# Patient Record
Sex: Female | Born: 1975 | Race: White | Hispanic: No | Marital: Single | Smoking: Former smoker
Health system: Southern US, Community
[De-identification: ages and names within clinical notes are randomized; demographics above are authoritative.]

## PROBLEM LIST (undated history)

## (undated) DIAGNOSIS — F329 Major depressive disorder, single episode, unspecified: Secondary | ICD-10-CM

## (undated) DIAGNOSIS — R87619 Unspecified abnormal cytological findings in specimens from cervix uteri: Secondary | ICD-10-CM

## (undated) DIAGNOSIS — N12 Tubulo-interstitial nephritis, not specified as acute or chronic: Secondary | ICD-10-CM

## (undated) DIAGNOSIS — N390 Urinary tract infection, site not specified: Secondary | ICD-10-CM

## (undated) DIAGNOSIS — IMO0002 Reserved for concepts with insufficient information to code with codable children: Secondary | ICD-10-CM

## (undated) DIAGNOSIS — E7211 Homocystinuria: Secondary | ICD-10-CM

## (undated) DIAGNOSIS — K589 Irritable bowel syndrome without diarrhea: Secondary | ICD-10-CM

## (undated) DIAGNOSIS — J42 Unspecified chronic bronchitis: Secondary | ICD-10-CM

## (undated) DIAGNOSIS — F32A Depression, unspecified: Secondary | ICD-10-CM

## (undated) DIAGNOSIS — Z87442 Personal history of urinary calculi: Secondary | ICD-10-CM

## (undated) DIAGNOSIS — F419 Anxiety disorder, unspecified: Secondary | ICD-10-CM

## (undated) DIAGNOSIS — D6851 Activated protein C resistance: Secondary | ICD-10-CM

## (undated) DIAGNOSIS — G43909 Migraine, unspecified, not intractable, without status migrainosus: Secondary | ICD-10-CM

## (undated) DIAGNOSIS — F988 Other specified behavioral and emotional disorders with onset usually occurring in childhood and adolescence: Secondary | ICD-10-CM

## (undated) DIAGNOSIS — R7989 Other specified abnormal findings of blood chemistry: Secondary | ICD-10-CM

## (undated) DIAGNOSIS — D699 Hemorrhagic condition, unspecified: Secondary | ICD-10-CM

## (undated) DIAGNOSIS — I10 Essential (primary) hypertension: Secondary | ICD-10-CM

## (undated) HISTORY — DX: Major depressive disorder, single episode, unspecified: F32.9

## (undated) HISTORY — PX: FRACTURE SURGERY: SHX138

## (undated) HISTORY — DX: Hemorrhagic condition, unspecified: D69.9

## (undated) HISTORY — DX: Essential (primary) hypertension: I10

## (undated) HISTORY — DX: Unspecified abnormal cytological findings in specimens from cervix uteri: R87.619

## (undated) HISTORY — DX: Other specified behavioral and emotional disorders with onset usually occurring in childhood and adolescence: F98.8

## (undated) HISTORY — DX: Urinary tract infection, site not specified: N39.0

## (undated) HISTORY — DX: Tubulo-interstitial nephritis, not specified as acute or chronic: N12

## (undated) HISTORY — DX: Irritable bowel syndrome, unspecified: K58.9

## (undated) HISTORY — DX: Other specified abnormal findings of blood chemistry: R79.89

## (undated) HISTORY — DX: Homocystinuria: E72.11

## (undated) HISTORY — DX: Reserved for concepts with insufficient information to code with codable children: IMO0002

## (undated) HISTORY — DX: Activated protein C resistance: D68.51

## (undated) HISTORY — PX: THERAPEUTIC ABORTION: SHX798

## (undated) HISTORY — DX: Depression, unspecified: F32.A

---

## 2001-09-23 ENCOUNTER — Inpatient Hospital Stay (HOSPITAL_COMMUNITY): Admission: AD | Admit: 2001-09-23 | Discharge: 2001-09-23 | Payer: Self-pay | Admitting: Obstetrics

## 2001-11-26 ENCOUNTER — Inpatient Hospital Stay (HOSPITAL_COMMUNITY): Admission: AD | Admit: 2001-11-26 | Discharge: 2001-11-26 | Payer: Self-pay | Admitting: *Deleted

## 2001-11-28 ENCOUNTER — Ambulatory Visit (HOSPITAL_COMMUNITY): Admission: RE | Admit: 2001-11-28 | Discharge: 2001-11-28 | Payer: Self-pay | Admitting: Obstetrics

## 2001-12-09 ENCOUNTER — Emergency Department (HOSPITAL_COMMUNITY): Admission: EM | Admit: 2001-12-09 | Discharge: 2001-12-09 | Payer: Self-pay | Admitting: Emergency Medicine

## 2001-12-09 ENCOUNTER — Encounter: Payer: Self-pay | Admitting: Emergency Medicine

## 2002-02-24 ENCOUNTER — Observation Stay (HOSPITAL_COMMUNITY): Admission: AD | Admit: 2002-02-24 | Discharge: 2002-02-25 | Payer: Self-pay

## 2002-04-20 ENCOUNTER — Inpatient Hospital Stay (HOSPITAL_COMMUNITY): Admission: AD | Admit: 2002-04-20 | Discharge: 2002-04-22 | Payer: Self-pay

## 2002-06-25 ENCOUNTER — Other Ambulatory Visit: Admission: RE | Admit: 2002-06-25 | Discharge: 2002-06-25 | Payer: Self-pay

## 2004-10-10 ENCOUNTER — Other Ambulatory Visit: Admission: RE | Admit: 2004-10-10 | Discharge: 2004-10-10 | Payer: Self-pay | Admitting: Obstetrics and Gynecology

## 2006-01-18 ENCOUNTER — Other Ambulatory Visit: Admission: RE | Admit: 2006-01-18 | Discharge: 2006-01-18 | Payer: Self-pay | Admitting: Obstetrics and Gynecology

## 2006-01-28 ENCOUNTER — Emergency Department (HOSPITAL_COMMUNITY): Admission: EM | Admit: 2006-01-28 | Discharge: 2006-01-28 | Payer: Self-pay | Admitting: Emergency Medicine

## 2007-03-14 ENCOUNTER — Other Ambulatory Visit: Admission: RE | Admit: 2007-03-14 | Discharge: 2007-03-14 | Payer: Self-pay | Admitting: Obstetrics and Gynecology

## 2007-06-20 ENCOUNTER — Emergency Department (HOSPITAL_COMMUNITY): Admission: EM | Admit: 2007-06-20 | Discharge: 2007-06-20 | Payer: Self-pay | Admitting: Emergency Medicine

## 2007-07-08 ENCOUNTER — Emergency Department (HOSPITAL_COMMUNITY): Admission: EM | Admit: 2007-07-08 | Discharge: 2007-07-08 | Payer: Self-pay | Admitting: Emergency Medicine

## 2007-09-29 ENCOUNTER — Emergency Department (HOSPITAL_COMMUNITY): Admission: EM | Admit: 2007-09-29 | Discharge: 2007-09-30 | Payer: Self-pay | Admitting: Emergency Medicine

## 2007-10-04 ENCOUNTER — Encounter: Admission: RE | Admit: 2007-10-04 | Discharge: 2007-10-04 | Payer: Self-pay | Admitting: Internal Medicine

## 2008-05-01 ENCOUNTER — Emergency Department (HOSPITAL_COMMUNITY): Admission: EM | Admit: 2008-05-01 | Discharge: 2008-05-02 | Payer: Self-pay | Admitting: Emergency Medicine

## 2008-05-21 ENCOUNTER — Other Ambulatory Visit: Admission: RE | Admit: 2008-05-21 | Discharge: 2008-05-21 | Payer: Self-pay | Admitting: Obstetrics and Gynecology

## 2009-12-04 HISTORY — PX: TUBAL LIGATION: SHX77

## 2010-02-10 ENCOUNTER — Ambulatory Visit (HOSPITAL_COMMUNITY): Admission: RE | Admit: 2010-02-10 | Discharge: 2010-02-10 | Payer: Self-pay | Admitting: Obstetrics and Gynecology

## 2010-11-17 ENCOUNTER — Other Ambulatory Visit
Admission: RE | Admit: 2010-11-17 | Discharge: 2010-11-17 | Payer: Self-pay | Source: Home / Self Care | Admitting: Obstetrics and Gynecology

## 2010-11-30 ENCOUNTER — Emergency Department (HOSPITAL_COMMUNITY)
Admission: EM | Admit: 2010-11-30 | Discharge: 2010-11-30 | Payer: Self-pay | Source: Home / Self Care | Admitting: Emergency Medicine

## 2010-12-25 ENCOUNTER — Encounter: Payer: Self-pay | Admitting: Internal Medicine

## 2011-02-26 LAB — URINE MICROSCOPIC-ADD ON

## 2011-02-26 LAB — URINALYSIS, ROUTINE W REFLEX MICROSCOPIC
Ketones, ur: NEGATIVE mg/dL
Specific Gravity, Urine: 1.015 (ref 1.005–1.030)
Urobilinogen, UA: 0.2 mg/dL (ref 0.0–1.0)
pH: 6 (ref 5.0–8.0)

## 2011-02-26 LAB — BASIC METABOLIC PANEL
BUN: 7 mg/dL (ref 6–23)
CO2: 23 mEq/L (ref 19–32)
Calcium: 9.7 mg/dL (ref 8.4–10.5)
Creatinine, Ser: 0.88 mg/dL (ref 0.4–1.2)
GFR calc Af Amer: >60 mL/min (ref >60)
GFR calc non Af Amer: >60 mL/min (ref >60)
Sodium: 136 mEq/L (ref 135–145)

## 2011-02-26 LAB — CBC
Hemoglobin: 15.8 g/dL — ABNORMAL HIGH (ref 12.0–15.0)
MCV: 95.1 fL (ref 78.0–100.0)
Platelets: 246 10*3/uL (ref 150–400)
RBC: 4.84 MIL/uL (ref 3.87–5.11)
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
## 2011-08-30 LAB — HEMOGLOBIN AND HEMATOCRIT, BLOOD
HCT: 39.2
Hemoglobin: 13.6

## 2011-09-13 LAB — URINE MICROSCOPIC-ADD ON

## 2011-09-13 LAB — PREGNANCY, URINE: Preg Test, Ur: NEGATIVE

## 2011-09-13 LAB — URINALYSIS, ROUTINE W REFLEX MICROSCOPIC
Protein, ur: 100 — AB
Specific Gravity, Urine: 1.023
Urobilinogen, UA: 1

## 2012-01-06 LAB — HM PAP SMEAR: HM Pap smear: NEGATIVE

## 2013-02-21 ENCOUNTER — Encounter: Payer: Self-pay | Admitting: *Deleted

## 2013-02-28 ENCOUNTER — Ambulatory Visit: Payer: Self-pay | Admitting: Obstetrics and Gynecology

## 2013-03-11 ENCOUNTER — Ambulatory Visit (INDEPENDENT_AMBULATORY_CARE_PROVIDER_SITE_OTHER): Payer: BC Managed Care – PPO | Admitting: Obstetrics and Gynecology

## 2013-03-11 ENCOUNTER — Encounter: Payer: Self-pay | Admitting: Obstetrics and Gynecology

## 2013-03-11 VITALS — BP 112/70 | Ht 66.0 in | Wt 140.0 lb

## 2013-03-11 DIAGNOSIS — Z01419 Encounter for gynecological examination (general) (routine) without abnormal findings: Secondary | ICD-10-CM

## 2013-03-11 DIAGNOSIS — F988 Other specified behavioral and emotional disorders with onset usually occurring in childhood and adolescence: Secondary | ICD-10-CM

## 2013-03-11 DIAGNOSIS — D6859 Other primary thrombophilia: Secondary | ICD-10-CM

## 2013-03-11 DIAGNOSIS — I10 Essential (primary) hypertension: Secondary | ICD-10-CM | POA: Insufficient documentation

## 2013-03-11 DIAGNOSIS — G43909 Migraine, unspecified, not intractable, without status migrainosus: Secondary | ICD-10-CM | POA: Insufficient documentation

## 2013-03-11 DIAGNOSIS — D6852 Prothrombin gene mutation: Secondary | ICD-10-CM | POA: Insufficient documentation

## 2013-03-11 DIAGNOSIS — Z0000 Encounter for general adult medical examination without abnormal findings: Secondary | ICD-10-CM

## 2013-03-11 DIAGNOSIS — F329 Major depressive disorder, single episode, unspecified: Secondary | ICD-10-CM

## 2013-03-11 LAB — POCT URINALYSIS DIPSTICK
Bilirubin, UA: NEGATIVE
Clarity, UA: NEGATIVE
Glucose, UA: NEGATIVE
Ketones, UA: NEGATIVE

## 2013-03-11 NOTE — Patient Instructions (Signed)

## 2013-03-11 NOTE — Progress Notes (Signed)
37 y.o.  Single  Caucasian female   Lauren Matthews here for annual exam.  Menses nl and regular.    Patient's last menstrual period was 02/25/2013.          Sexually active: yes  The current method of family planning is tubal ligation and condoms.  Exercising: cardio, weights 3 days a week Last mammogram:  never Last pap smear:01/30/12 neg History of abnormal pap: 2005 Smoking:1/4 pack a day Alcohol:socially Last colonoscopy:never Last Bone Density:  never Last tetanus shot:2009 Last cholesterol check: not sure  Hgb:  14.0              Urine:neg    Health Maintenance  Topic Date Due  . Influenza Vaccine  08/04/2013  . Pap Smear  01/05/2015  . Tetanus/tdap  12/04/2022    Family History  Problem Relation Age of Onset  . Hypertension Mother   . Deep vein thrombosis Father   . Pulmonary embolism Father   . Bleeding Disorder Father   . Hypertension Maternal Grandmother   . Breast cancer Paternal Grandmother   . Bleeding Disorder Sister   . Deep vein thrombosis Sister     There is no problem list on file for this patient.   Past Medical History  Diagnosis Date  . Migraine aura, persistent   . Abnormal Pap smear   . Increased homocysteine   . Hypertension     diet controlled  . Depression   . UTI (urinary tract infection)   . ADD (attention deficit disorder)   . Pyelonephritis   . Bleeding disorder     Past Surgical History  Procedure Laterality Date  . Tubal ligation Bilateral 2011  . Therapeutic abortion      x3    Allergies: Review of patient's allergies indicates no known allergies.  Current Outpatient Prescriptions  Medication Sig Dispense Refill  . ibuprofen (ADVIL,MOTRIN) 200 MG tablet Take 200 mg by mouth as needed for pain.       No current facility-administered medications for this visit.    ROS: Pertinent items are noted in HPI.  Exam:    BP 112/70  Ht 5\' 6"  (1.676 m)  Wt 140 lb (63.504 kg)  BMI 22.61 kg/m2  LMP 02/25/2013   Wt Readings  from Last 3 Encounters:  03/11/13 140 lb (63.504 kg)     Ht Readings from Last 3 Encounters:  03/11/13 5\' 6"  (1.676 m)    General appearance: alert, cooperative and appears stated age Head: Normocephalic, without obvious abnormality, atraumatic Neck: no adenopathy, supple, symmetrical, trachea midline and thyroid not enlarged, symmetric, no tenderness/mass/nodules Lungs: clear to auscultation bilaterally Breasts: Inspection negative, No nipple retraction or dimpling, No nipple discharge or bleeding, No axillary or supraclavicular adenopathy, Normal to palpation without dominant masses Heart: regular rate and rhythm Abdomen: soft, non-tender; bowel sounds normal; no masses,  no organomegaly Extremities: extremities normal, atraumatic, no cyanosis or edema Skin: Skin color, texture, turgor normal. No rashes or lesions Lymph nodes: Cervical, supraclavicular, and axillary nodes normal. No abnormal inguinal nodes palpated Neurologic: Grossly normal   Pelvic: External genitalia:  no lesions              Urethra:  normal appearing urethra with no masses, tenderness or lesions              Bartholins and Skenes: normal                 Vagina: normal appearing vagina with normal color and discharge, no  lesions              Cervix: normal appearance              Pap taken: no        Bimanual Exam:  Uterus:  uterus is normal size, shape, consistency and nontender, mid position, NT, nl size                                      Adnexa: normal adnexa in size, nontender and no masses                                      Rectovaginal: Confirms                                      Anus:  normal sphincter tone, no lesions  A: normal gyn exam      S/p BTSP     Prothrombin II gene mutation, estrogen contraindicated  P: counseled on breast self exam, adequate intake of calcium and vitamin D, diet and exercise return annually or prn     An After Visit Summary was printed and given to the  patient.

## 2013-03-14 LAB — HEMOGLOBIN, FINGERSTICK: Hemoglobin, fingerstick: 14 g/dL (ref 12.0–16.0)

## 2013-04-16 ENCOUNTER — Encounter: Payer: Self-pay | Admitting: Certified Nurse Midwife

## 2013-04-16 ENCOUNTER — Ambulatory Visit (INDEPENDENT_AMBULATORY_CARE_PROVIDER_SITE_OTHER): Payer: BC Managed Care – PPO | Admitting: Certified Nurse Midwife

## 2013-04-16 VITALS — BP 110/58 | HR 74 | Resp 12 | Ht 66.0 in | Wt 137.6 lb

## 2013-04-16 DIAGNOSIS — N760 Acute vaginitis: Secondary | ICD-10-CM

## 2013-04-16 DIAGNOSIS — Z2089 Contact with and (suspected) exposure to other communicable diseases: Secondary | ICD-10-CM

## 2013-04-16 DIAGNOSIS — Z202 Contact with and (suspected) exposure to infections with a predominantly sexual mode of transmission: Secondary | ICD-10-CM

## 2013-04-16 MED ORDER — METRONIDAZOLE 0.75 % VA GEL: 1.0000 | Freq: Every day | VAGINAL | Status: DC

## 2013-04-16 NOTE — Progress Notes (Signed)
37 y.o.Single Caucasian female [phone] with a 1 week(s) history of the following:discharge described as grey and odor Sexually active: yes Last sexual activity:8 days ago. Pt also reports the following associated symptoms: none Patient has not tried over the counter treatment. Patient works out 3 days a week and stays in gym clothes for long periods of time.  Also has changed body wash recently, prior to symptoms of odor. Desires STD screen, GC, Chlamydia   O:Health female, WDWN Affect: Nomal orientation x3    Exam:  ION:GEXBMW, Bartholin's, Urethra, Skene's normal                UXL:KGMWNUUVO: watery, malodorous and grey in color, pH 5.0, wet prep done                Cx:  normal appearance, nabothian cysts and non tender Specimen collected                Uterus:normal size, anteverted, normal shape and consistency                Adnexa: normal adnexa and no mass, fullness, tenderness  Wet Prep shows:clue cells negative for yeast or trich   A:Bacterial vaginosis STD screening  P; Reviewed findings Rx Metrogel see order Discussed changing underwear after work out, to limit moisture exposure in vaginal area.  Aveeno or baking soda sitz bath prn comfort Lab:GC, Chlamydia  Rv prn     Reviewed, TL

## 2013-04-17 LAB — IPS N GONORRHOEA AND CHLAMYDIA BY PCR

## 2013-04-18 ENCOUNTER — Telehealth: Payer: Self-pay

## 2013-04-18 NOTE — Telephone Encounter (Signed)
Message copied by Eliezer Bottom on Fri Apr 18, 2013 11:48 AM ------      Message from: Verner Chol      Created: Thu Apr 17, 2013  4:59 PM       Notify GC, CHL negative      Routed to [employer] ------

## 2013-04-18 NOTE — Telephone Encounter (Signed)
Left message for call back.

## 2013-04-22 NOTE — Telephone Encounter (Signed)
Left message for call back.

## 2013-04-23 NOTE — Telephone Encounter (Signed)
Patient notified of results.

## 2013-04-23 NOTE — Telephone Encounter (Signed)
Left message to call back  

## 2013-04-23 NOTE — Telephone Encounter (Signed)
Patient returned Joy's phone call.

## 2013-04-23 NOTE — Telephone Encounter (Signed)
Patient returned Joy's phone call.  °

## 2014-01-20 ENCOUNTER — Emergency Department (HOSPITAL_COMMUNITY)
Admission: EM | Admit: 2014-01-20 | Discharge: 2014-01-20 | Disposition: A | Discharge status: Home / Self Care | Payer: Self-pay | Attending: Emergency Medicine | Admitting: Emergency Medicine

## 2014-01-20 ENCOUNTER — Emergency Department (HOSPITAL_COMMUNITY): Payer: Self-pay

## 2014-01-20 ENCOUNTER — Encounter (HOSPITAL_COMMUNITY): Payer: Self-pay | Admitting: Emergency Medicine

## 2014-01-20 DIAGNOSIS — R61 Generalized hyperhidrosis: Secondary | ICD-10-CM | POA: Insufficient documentation

## 2014-01-20 DIAGNOSIS — Z79899 Other long term (current) drug therapy: Secondary | ICD-10-CM | POA: Insufficient documentation

## 2014-01-20 DIAGNOSIS — R1012 Left upper quadrant pain: Secondary | ICD-10-CM | POA: Insufficient documentation

## 2014-01-20 DIAGNOSIS — R1084 Generalized abdominal pain: Secondary | ICD-10-CM | POA: Insufficient documentation

## 2014-01-20 DIAGNOSIS — Z3202 Encounter for pregnancy test, result negative: Secondary | ICD-10-CM | POA: Insufficient documentation

## 2014-01-20 DIAGNOSIS — Z862 Personal history of diseases of the blood and blood-forming organs and certain disorders involving the immune mechanism: Secondary | ICD-10-CM | POA: Insufficient documentation

## 2014-01-20 DIAGNOSIS — Z87440 Personal history of urinary (tract) infections: Secondary | ICD-10-CM | POA: Insufficient documentation

## 2014-01-20 DIAGNOSIS — R42 Dizziness and giddiness: Secondary | ICD-10-CM | POA: Insufficient documentation

## 2014-01-20 DIAGNOSIS — M546 Pain in thoracic spine: Secondary | ICD-10-CM | POA: Insufficient documentation

## 2014-01-20 DIAGNOSIS — R112 Nausea with vomiting, unspecified: Secondary | ICD-10-CM | POA: Insufficient documentation

## 2014-01-20 DIAGNOSIS — Z8659 Personal history of other mental and behavioral disorders: Secondary | ICD-10-CM | POA: Insufficient documentation

## 2014-01-20 DIAGNOSIS — K625 Hemorrhage of anus and rectum: Secondary | ICD-10-CM | POA: Insufficient documentation

## 2014-01-20 DIAGNOSIS — R109 Unspecified abdominal pain: Secondary | ICD-10-CM

## 2014-01-20 DIAGNOSIS — F17200 Nicotine dependence, unspecified, uncomplicated: Secondary | ICD-10-CM | POA: Insufficient documentation

## 2014-01-20 DIAGNOSIS — Z8639 Personal history of other endocrine, nutritional and metabolic disease: Secondary | ICD-10-CM | POA: Insufficient documentation

## 2014-01-20 DIAGNOSIS — I10 Essential (primary) hypertension: Secondary | ICD-10-CM | POA: Insufficient documentation

## 2014-01-20 DIAGNOSIS — R630 Anorexia: Secondary | ICD-10-CM | POA: Insufficient documentation

## 2014-01-20 DIAGNOSIS — Z87448 Personal history of other diseases of urinary system: Secondary | ICD-10-CM | POA: Insufficient documentation

## 2014-01-20 LAB — COMPREHENSIVE METABOLIC PANEL
ALBUMIN: 4.4 g/dL (ref 3.5–5.2)
ALT: 15 U/L (ref 0–35)
AST: 22 U/L (ref 0–37)
Alkaline Phosphatase: 71 U/L (ref 39–117)
BUN: 6 mg/dL (ref 6–23)
CALCIUM: 9.7 mg/dL (ref 8.4–10.5)
CO2: 27 mEq/L (ref 19–32)
Chloride: 100 mEq/L (ref 96–112)
Creatinine, Ser: 0.83 mg/dL (ref 0.50–1.10)
GFR calc non Af Amer: >90 mL/min (ref >90)
Glucose, Bld: 87 mg/dL (ref 70–99)
Potassium: 3.8 mEq/L (ref 3.7–5.3)
SODIUM: 142 meq/L (ref 137–147)
TOTAL PROTEIN: 7.4 g/dL (ref 6.0–8.3)
Total Bilirubin: 0.4 mg/dL (ref 0.3–1.2)

## 2014-01-20 LAB — OCCULT BLOOD, POC DEVICE: Fecal Occult Bld: NEGATIVE

## 2014-01-20 LAB — CBC WITH DIFFERENTIAL/PLATELET
BASOS ABS: 0 10*3/uL (ref 0.0–0.1)
BASOS PCT: 0 % (ref 0–1)
EOS ABS: 0 10*3/uL (ref 0.0–0.7)
EOS PCT: 1 % (ref 0–5)
HCT: 45.2 % (ref 36.0–46.0)
Hemoglobin: 16.1 g/dL — ABNORMAL HIGH (ref 12.0–15.0)
Lymphocytes Relative: 21 % (ref 12–46)
Lymphs Abs: 1.3 10*3/uL (ref 0.7–4.0)
MCH: 33.3 pg (ref 26.0–34.0)
MCHC: 35.6 g/dL (ref 30.0–36.0)
MCV: 93.4 fL (ref 78.0–100.0)
Monocytes Absolute: 0.9 10*3/uL (ref 0.1–1.0)
Monocytes Relative: 15 % — ABNORMAL HIGH (ref 3–12)
Neutro Abs: 3.9 10*3/uL (ref 1.7–7.7)
Neutrophils Relative %: 63 % (ref 43–77)
PLATELETS: 163 10*3/uL (ref 150–400)
RBC: 4.84 MIL/uL (ref 3.87–5.11)
RDW: 12 % (ref 11.5–15.5)
WBC: 6.1 10*3/uL (ref 4.0–10.5)

## 2014-01-20 LAB — PREGNANCY, URINE: PREG TEST UR: NEGATIVE

## 2014-01-20 LAB — LIPASE, BLOOD: Lipase: 39 U/L (ref 11–59)

## 2014-01-20 LAB — SAMPLE TO BLOOD BANK

## 2014-01-20 LAB — POCT PREGNANCY, URINE: Preg Test, Ur: NEGATIVE

## 2014-01-20 MED ORDER — GI COCKTAIL ~~LOC~~
30.0000 mL | Freq: Once | ORAL | Status: AC
  Administered 2014-01-20: 30 mL via ORAL
  Filled 2014-01-20: qty 30

## 2014-01-20 MED ORDER — SODIUM CHLORIDE 0.9 % IV BOLUS (SEPSIS)
1000.0000 mL | Freq: Once | INTRAVENOUS | Status: AC
  Administered 2014-01-20: 1000 mL via INTRAVENOUS

## 2014-01-20 MED ORDER — IOHEXOL 300 MG/ML  SOLN
100.0000 mL | Freq: Once | INTRAMUSCULAR | Status: AC | PRN
  Administered 2014-01-20: 100 mL via INTRAVENOUS

## 2014-01-20 MED ORDER — IOHEXOL 300 MG/ML  SOLN
25.0000 mL | INTRAMUSCULAR | Status: AC
  Administered 2014-01-20: 25 mL via ORAL

## 2014-01-20 MED ORDER — OMEPRAZOLE 20 MG PO CPDR: 40.0000 mg | DELAYED_RELEASE_CAPSULE | Freq: Every day | ORAL | Status: DC

## 2014-01-20 NOTE — ED Notes (Signed)
PA at bedside.

## 2014-01-20 NOTE — ED Provider Notes (Signed)
CSN: [id number]     Arrival date & time 01/20/14  1523 History   First MD Initiated Contact with Patient 01/20/14 1527     Chief Complaint  Patient presents with  . Back Pain  . Abdominal Pain  . Rectal Bleeding     (Consider location/radiation/quality/duration/timing/severity/associated sxs/prior Treatment) HPI Comments: Patient is a 38 y/o female with a hx of HTN and bleeding d/o (father is +factor V leiden) who presents for abdominal pain and back pain. Patient states symptoms began 5 days ago as anorexia which has persisted. She states that this progressed to back pain and abdominal pain, mostly brought on by eating. Pain is squeezing in nature and present in supraumbilical region. This pain is nonradiating; however, when patient experiences back pain it is in her thoracic back between her shoulder blades and radiates to the same spot in her abdomen. She states that pain is without alleviating factors. She has had 1 episode of NB/NB emesis yesterday as well as black tarry stools the last of which was this AM. She took a laxative for symptoms without relief. She endorses associated lightheadedness and night sweats. She denies fever, SOB, CP, urinary symptoms, vaginal c/o, hematochezia, hematemesis, numbness/tingling, weakness, and syncope. Patient denies a hx of reflux and abdominal surgeries other than tubal ligation.  Patient is a 38 y.o. female presenting with back pain, abdominal pain, and hematochezia. The history is provided by the patient. No language interpreter was used.  Back Pain Associated symptoms: abdominal pain   Associated symptoms: no chest pain, no dysuria, no fever, no numbness, no pelvic pain and no weakness   Abdominal Pain Associated symptoms: hematochezia, nausea and vomiting   Associated symptoms: no chest pain, no dysuria, no fever, no hematuria, no shortness of breath, no vaginal bleeding and no vaginal discharge   Rectal Bleeding Associated symptoms: abdominal pain  and vomiting   Associated symptoms: no fever     Past Medical History  Diagnosis Date  . Migraine aura, persistent   . Abnormal Pap smear   . Increased homocysteine   . Hypertension     diet controlled  . Depression   . UTI (urinary tract infection)   . ADD (attention deficit disorder)   . Pyelonephritis   . Bleeding disorder    Past Surgical History  Procedure Laterality Date  . Tubal ligation Bilateral 2011  . Therapeutic abortion      x3   Family History  Problem Relation Age of Onset  . Hypertension Mother   . Deep vein thrombosis Father   . Pulmonary embolism Father   . Bleeding Disorder Father   . Hypertension Maternal Grandmother   . Breast cancer Paternal Grandmother   . Bleeding Disorder Sister   . Deep vein thrombosis Sister    History  Substance Use Topics  . Smoking status: Light Tobacco Smoker    Types: Cigarettes  . Smokeless tobacco: Never Used  . Alcohol Use: 0.5 oz/week    1 drink(s) per week     Comment: socially   OB History   Grav Para Term Preterm Abortions TAB SAB Ect Mult Living   5 2 2  3 3    2      Review of Systems  Constitutional: Positive for diaphoresis. Negative for fever.  Respiratory: Negative for shortness of breath.   Cardiovascular: Negative for chest pain.  Gastrointestinal: Positive for nausea, vomiting, abdominal pain, blood in stool and hematochezia.  Genitourinary: Negative for dysuria, hematuria, vaginal bleeding, vaginal discharge and  pelvic pain.  Musculoskeletal: Positive for back pain.  Neurological: Negative for weakness and numbness.  All other systems reviewed and are negative.      Allergies  Review of patient's allergies indicates no known allergies.  Home Medications   Current Outpatient Rx  Name  Route  Sig  Dispense  Refill  . ibuprofen (ADVIL,MOTRIN) 200 MG tablet   Oral   Take 400 mg by mouth every 8 (eight) hours as needed for mild pain.          Marland Kitchen omeprazole (PRILOSEC) 20 MG capsule    Oral   Take 2 capsules (40 mg total) by mouth daily.   60 capsule   1    BP 135/91  Pulse 74  Temp(Src) 98.3 F (36.8 C) (Oral)  Resp 18  SpO2 100%  LMP 12/29/2013  Physical Exam  Nursing note and vitals reviewed. Constitutional: She is oriented to person, place, and time. She appears well-developed and well-nourished. No distress.  HENT:  Head: Normocephalic and atraumatic.  Mouth/Throat: Oropharynx is clear and moist. No oropharyngeal exudate.  Eyes: Conjunctivae and EOM are normal. Pupils are equal, round, and reactive to light. No scleral icterus.  Neck: Normal range of motion.  Cardiovascular: Normal rate, regular rhythm and normal heart sounds.   Pulmonary/Chest: Effort normal and breath sounds normal. No respiratory distress. She has no wheezes. She has no rales.  Abdominal: Soft. She exhibits no distension and no mass. There is tenderness. There is no rebound and no guarding.  Soft. Patient has diffuse tenderness on palpation, however, appears to be more tender in the epigastric and left upper quadrant. No rebound or guarding. No peritoneal signs.  Genitourinary: Rectal exam shows no mass, no tenderness and anal tone normal.  Black appearing stool on DRE without BRBPR. Normal rectal tone.  Musculoskeletal: Normal range of motion.  Tenderness to palpation of the thoracic paraspinal muscles between base of the shoulder blades. No midline TTP. No bony deformities or step offs.  Neurological: She is alert and oriented to person, place, and time.  Skin: Skin is warm and dry. No rash noted. She is not diaphoretic. No erythema. No pallor.  Psychiatric: She has a normal mood and affect. Her behavior is normal.    ED Course  Procedures (including critical care time) Labs Review Labs Reviewed  CBC WITH DIFFERENTIAL - Abnormal; Notable for the following:    Hemoglobin 16.1 (*)    Monocytes Relative 15 (*)    All other components within normal limits  COMPREHENSIVE METABOLIC  PANEL  LIPASE, BLOOD  PREGNANCY, URINE  URINALYSIS, ROUTINE W REFLEX MICROSCOPIC  OCCULT BLOOD, POC DEVICE  POCT PREGNANCY, URINE  SAMPLE TO BLOOD BANK   Imaging Review Ct Abdomen Pelvis W Contrast  01/20/2014   CLINICAL DATA:  Back pain rectal bleeding  EXAM: CT ABDOMEN AND PELVIS WITH CONTRAST  TECHNIQUE: Multidetector CT imaging of the abdomen and pelvis was performed using the standard protocol following bolus administration of intravenous contrast.  CONTRAST:  18mL OMNIPAQUE IOHEXOL 300 MG/ML  SOLN  COMPARISON:  CT ABDOMEN W/O CM dated 09/29/2007  FINDINGS: Lung bases are clear.  No pericardial fluid.  No focal hepatic lesion. Gallbladder, pancreas, spleen, and kidneys are normal. There is a mild thickening of the left adrenal gland which is unchanged from prior. This likely represents hyperplasia. The right adrenal glands normal.  The stomach, small bowel, appendix, and cecum are normal. The colon and rectosigmoid colon are normal.  Abdominal aorta is normal caliber.  No retroperitoneal or periportal lymphadenopathy. No free fluid the pelvis. Uterus and ovaries are normal. Tubal ligation clips noted. Enhancing follicle in the left ovary. No free fluid.  No aggressive osseous lesion.  IMPRESSION: 1. No CT explanation for rectal bleeding. 2. No acute findings in  the abdomen or pelvis.   Electronically Signed   By: Suzy Bouchard M.D.   On: 01/20/2014 18:28    EKG Interpretation    Date/Time:  Tuesday January 20 2014 15:44:08 EST Ventricular Rate:  75 PR Interval:  107 QRS Duration: 99 QT Interval:  414 QTC Calculation: 462 R Axis:   79 Text Interpretation:  Sinus rhythm Short PR interval RSR' in V1 or V2, right VCD or RVH Confirmed by ZAVITZ  MD, JOSHUA ([id number]) on 01/20/2014 7:47:18 PM            MDM   Final diagnoses:  Abdominal pain   38 year old female presents to the emergency department for anorexia, supraumbilical abdominal pain, thoracic back pain, and black tarry  stool. Patient states the symptoms have been progressing over the last 5 days. Patient is well and nontoxic appearing, hemodynamically stable, and afebrile on arrival. Physical exam today significant for diffuse abdominal tenderness with mild focal tenderness in the epigastric and medial left upper quadrant. No peritoneal signs appreciated. Patient also with black appearing stool on DRE.  Workup today included CBC, CMP, lipase, urine pregnancy, and hemoccult. Hemoccult is negative for blood. Patient also without anemia or leukocytosis. Electrolytes normal and liver and kidney function preserved. Lipase WNL. CT abdomen pelvis without any acute intra-abdominal or pelvic findings. Patient has been able to tolerate oral contrast without emesis. She states she feels "fairly good" overall. Symptoms most consistent with gastric ulcer given anorexia, location of pain, and N/V. Will start patient on 40mg  Omeprazole QD and refer to GI. Patient stable and appropriate for discharge today with instruction to followup also with her primary care provider. Return precautions provided and patient agreeable to plan with no unaddressed concerns.    Antonietta Breach, PA-C 01/20/14 1958

## 2014-01-20 NOTE — ED Notes (Signed)
Patient given specimen cup for urine.

## 2014-01-20 NOTE — ED Notes (Signed)
MD at bedside. 

## 2014-01-20 NOTE — ED Notes (Signed)
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

## 2014-01-20 NOTE — ED Notes (Signed)
Pt dc to home. Pt sts understanding to dc instructions. Pt ambulatory to exit without difficulty.

## 2014-01-20 NOTE — Discharge Instructions (Signed)
Be getting Prilosec as prescribed. Recommend you followup with a gastroenterologist. Followup with your primary care provider as needed. Return if symptoms worsen.  Peptic Ulcer A peptic ulcer is a sore in the lining of in your esophagus (esophageal ulcer), stomach (gastric ulcer), or in the first part of your small intestine (duodenal ulcer). The ulcer causes erosion into the deeper tissue. CAUSES  Normally, the lining of the stomach and the small intestine protects itself from the acid that digests food. The protective lining can be damaged by:  An infection caused by a bacterium called Helicobacter pylori (H. pylori).  Regular use of nonsteroidal anti-inflammatory drugs (NSAIDs), such as ibuprofen or aspirin.  Smoking tobacco. Other risk factors include being older than 62, drinking alcohol excessively, and having a family history of ulcer disease.  SYMPTOMS   Burning pain or gnawing in the area between the chest and the belly button.  Heartburn.  Nausea and vomiting.  Bloating. The pain can be worse on an empty stomach and at night. If the ulcer results in bleeding, it can cause:  Black, tarry stools.  Vomiting of bright red blood.  Vomiting of coffee ground looking materials. DIAGNOSIS  A diagnosis is usually made based upon your history and an exam. Other tests and procedures may be performed to find the cause of the ulcer. Finding a cause will help determine the best treatment. Tests and procedures may include:  Blood tests, stool tests, or breath tests to check for the bacterium H. pylori.  An upper gastrointestinal (GI) series of the esophagus, stomach, and small intestine.  An endoscopy to examine the esophagus, stomach, and small intestine.  A biopsy. TREATMENT  Treatment may include:  Eliminating the cause of the ulcer, such as smoking, NSAIDs, or alcohol.  Medicines to reduce the amount of acid in your digestive tract.  Antibiotic medicines if the ulcer is  caused by the H. pylori bacterium.  An upper endoscopy to treat a bleeding ulcer.  Surgery if the bleeding is severe or if the ulcer created a hole somewhere in the digestive system. HOME CARE INSTRUCTIONS   Avoid tobacco, alcohol, and caffeine. Smoking can increase the acid in the stomach, and continued smoking will impair the healing of ulcers.  Avoid foods and drinks that seem to cause discomfort or aggravate your ulcer.  Only take medicines as directed by your caregiver. Do not substitute over-the-counter medicines for prescription medicines without talking to your caregiver.  Keep any follow-up appointments and tests as directed. SEEK MEDICAL CARE IF:   Your do not improve within 7 days of starting treatment.  You have ongoing indigestion or heartburn. SEEK IMMEDIATE MEDICAL CARE IF:   You have sudden, sharp, or persistent abdominal pain.  You have bloody or dark black, tarry stools.  You vomit blood or vomit that looks like coffee grounds.  You become light headed, weak, or feel faint.  You become sweaty or clammy. MAKE SURE YOU:   Understand these instructions.  Will watch your condition.  Will get help right away if you are not doing well or get worse. Document Released: 11/17/2000 Document Revised: 08/14/2012 Document Reviewed: 06/19/2012 [facility] Patient Information 2014 [place].

## 2014-01-21 NOTE — ED Provider Notes (Signed)
Medical screening examination/treatment/procedure(s) were conducted as a shared visit with non-physician practitioner(s) or resident  and myself.  I personally evaluated the patient during the encounter and agree with the findings and plan unless otherwise indicated.    I have personally reviewed any xrays and/ or EKG's with the provider and I agree with interpretation.   Epig pain worse with eating.  Minimal NSAIDS.  Mild tenderness epig and LUQ, well appearing, mild dry mm.   Clinically gastric/ ulcer.  CT no acute findings.  Fup with GI outpt.  Labs Reviewed  CBC WITH DIFFERENTIAL - Abnormal; Notable for the following:    Hemoglobin 16.1 (*)    Monocytes Relative 15 (*)    All other components within normal limits  COMPREHENSIVE METABOLIC PANEL  LIPASE, BLOOD  PREGNANCY, URINE  OCCULT BLOOD, POC DEVICE  POCT PREGNANCY, URINE  SAMPLE TO BLOOD BANK   Filed Vitals:   01/20/14 1830 01/20/14 1845 01/20/14 1900 01/20/14 1915  BP: 141/101 128/93 159/103 152/99  Pulse: 71 69 90   Temp:      TempSrc:      Resp:      SpO2: 100% 98% 100%    Epigastric pain, Blood in stools  Mariea Clonts, MD 01/24/14 [phone]

## 2014-10-05 ENCOUNTER — Encounter (HOSPITAL_COMMUNITY): Payer: Self-pay | Admitting: Emergency Medicine

## 2015-02-04 ENCOUNTER — Encounter: Payer: Self-pay | Admitting: Certified Nurse Midwife

## 2015-02-04 ENCOUNTER — Ambulatory Visit (INDEPENDENT_AMBULATORY_CARE_PROVIDER_SITE_OTHER): Payer: Medicaid Other | Admitting: Certified Nurse Midwife

## 2015-02-04 VITALS — BP 120/80 | HR 68 | Resp 16 | Ht 66.25 in | Wt 135.0 lb

## 2015-02-04 DIAGNOSIS — Z124 Encounter for screening for malignant neoplasm of cervix: Secondary | ICD-10-CM | POA: Diagnosis not present

## 2015-02-04 DIAGNOSIS — Z01419 Encounter for gynecological examination (general) (routine) without abnormal findings: Secondary | ICD-10-CM | POA: Diagnosis not present

## 2015-02-04 NOTE — Progress Notes (Signed)
39 y.o. [id number] Single  Caucasian Fe here for annual exam.  Periods normal, no issues. Patient is working on smoking cessation and is down to 5-6 cigarettes/ day. Patient has continued to work on. Migraines have decreased over the past year to two. Has aex scheduled for next week with PCP with labs. Noticed slight itching in between breasts, no rash or exudate. If rash develops will need to seen by dermatology. Has changed body wash, will switch to dove soap No STD concerns or screening needed.. No other health issues today.  Patient's last menstrual period was 01/28/2015.          Sexually active: Yes.    The current method of family planning is tubal ligation.    Exercising: Yes.    walking & other exercises Smoker:  yes  Health Maintenance: Pap:  01-30-12 neg MMG:  none Colonoscopy:  none BMD:   none TDaP:  2009 Labs: none Self breast exam: done occ   reports that she has been smoking Cigarettes.  She has never used smokeless tobacco. She reports that she drinks alcohol. She reports that she does not use illicit drugs.  Past Medical History  Diagnosis Date  . Migraine aura, persistent   . Abnormal Pap smear   . Increased homocysteine   . Hypertension     diet controlled  . Depression   . UTI (urinary tract infection)   . ADD (attention deficit disorder)   . Pyelonephritis   . Bleeding disorder     Past Surgical History  Procedure Laterality Date  . Tubal ligation Bilateral 2011  . Therapeutic abortion      x3    Current Outpatient Prescriptions  Medication Sig Dispense Refill  . ibuprofen (ADVIL,MOTRIN) 200 MG tablet Take 400 mg by mouth every 8 (eight) hours as needed for mild pain.      No current facility-administered medications for this visit.    Family History  Problem Relation Age of Onset  . Hypertension Mother   . Deep vein thrombosis Father   . Pulmonary embolism Father   . Bleeding Disorder Father   . Hypertension Maternal Grandmother   . Breast  cancer Paternal Grandmother   . Bleeding Disorder Sister   . Deep vein thrombosis Sister     ROS:  Pertinent items are noted in HPI.  Otherwise, a comprehensive ROS was negative.  Exam:   BP 120/80 mmHg  Pulse 68  Resp 16  Ht 5' 6.25" (1.683 m)  Wt 135 lb (61.236 kg)  BMI 21.62 kg/m2  LMP 01/28/2015 Height: 5' 6.25" (168.3 cm) Ht Readings from Last 3 Encounters:  02/04/15 5' 6.25" (1.683 m)  04/16/13 5\' 6"  (1.676 m)  03/11/13 5\' 6"  (1.676 m)    General appearance: alert, cooperative and appears stated age Head: Normocephalic, without obvious abnormality, atraumatic Neck: no adenopathy, supple, symmetrical, trachea midline and thyroid normal to inspection and palpation Lungs: clear to auscultation bilaterally Breasts: normal appearance, no masses or tenderness, No nipple retraction or dimpling, No nipple discharge or bleeding, No axillary or supraclavicular adenopathy Heart: regular rate and rhythm Abdomen: soft, non-tender; no masses,  no organomegaly Extremities: extremities normal, atraumatic, no cyanosis or edema Skin: Skin color, texture, turgor normal. No rashes or lesions Lymph nodes: Cervical, supraclavicular, and axillary nodes normal. No abnormal inguinal nodes palpated Neurologic: Grossly normal   Pelvic: External genitalia:  no lesions              Urethra:  normal appearing urethra with  no masses, tenderness or lesions              Bartholin's and Skene's: normal                 Vagina: normal appearing vagina with normal color and discharge, no lesions              Cervix: normal appearance, non tender, no lesions              Pap taken: Yes.   Bimanual Exam:  Uterus:  normal size, contour, position, consistency, mobility, non-tender              Adnexa: normal adnexa and no mass, fullness, tenderness               Rectovaginal: Confirms               Anus:  normal sphincter tone, no lesions  Chaperone present: Yes  A:  Well Woman with normal  exam  Contraception BTL  Smoking Cessation in progress  P:   Reviewed health and wellness pertinent to exam  Encouraged to continue and enjoy the success of freedom from cigarettes and the cost.  Patient plans to continue.  Pap smear taken today with HPVHR   counseled on breast self exam, mammography screening, STD prevention,  adequate intake of calcium and vitamin D, diet and exercise  return annually or prn  An After Visit Summary was printed and given to the patient.

## 2015-02-04 NOTE — Patient Instructions (Signed)
EXERCISE AND DIET:  We recommended that you start or continue a regular exercise program for good health. Regular exercise means any activity that makes your heart beat faster and makes you sweat.  We recommend exercising at least 30 minutes per day at least 3 days a week, preferably 4 or 5.  We also recommend a diet low in fat and sugar.  Inactivity, poor dietary choices and obesity can cause diabetes, heart attack, stroke, and kidney damage, among others.    ALCOHOL AND SMOKING:  Women should limit their alcohol intake to no more than 7 drinks/beers/glasses of wine (combined, not each!) per week. Moderation of alcohol intake to this level decreases your risk of breast cancer and liver damage. And of course, no recreational drugs are part of a healthy lifestyle.  And absolutely no smoking or even second hand smoke. Most people know smoking can cause heart and lung diseases, but did you know it also contributes to weakening of your bones? Aging of your skin?  Yellowing of your teeth and nails?  CALCIUM AND VITAMIN D:  Adequate intake of calcium and Vitamin D are recommended.  The recommendations for exact amounts of these supplements seem to change often, but generally speaking 600 mg of calcium (either carbonate or citrate) and 800 units of Vitamin D per day seems prudent. Certain women may benefit from higher intake of Vitamin D.  If you are among these women, your doctor will have told you during your visit.    PAP SMEARS:  Pap smears, to check for cervical cancer or precancers,  have traditionally been done yearly, although recent scientific advances have shown that most women can have pap smears less often.  However, every woman still should have a physical exam from her gynecologist every year. It will include a breast check, inspection of the vulva and vagina to check for abnormal growths or skin changes, a visual exam of the cervix, and then an exam to evaluate the size and shape of the uterus and  ovaries.  And after 37 years of age, a rectal exam is indicated to check for rectal cancers. We will also provide age appropriate advice regarding health maintenance, like when you should have certain vaccines, screening for sexually transmitted diseases, bone density testing, colonoscopy, mammograms, etc.   MAMMOGRAMS:  All women over 40 years old should have a yearly mammogram. Many facilities now offer a "3D" mammogram, which may cost around $50 extra out of pocket. If possible,  we recommend you accept the option to have the 3D mammogram performed.  It both reduces the number of women who will be called back for extra views which then turn out to be normal, and it is better than the routine mammogram at detecting truly abnormal areas.    COLONOSCOPY:  Colonoscopy to screen for colon cancer is recommended for all women at age 50.  We know, you hate the idea of the prep.  We agree, BUT, having colon cancer and not knowing it is worse!!  Colon cancer so often starts as a polyp that can be seen and removed at colonscopy, which can quite literally save your life!  And if your first colonoscopy is normal and you have no family history of colon cancer, most women don't have to have it again for 10 years.  Once every ten years, you can do something that may end up saving your life, right?  We will be happy to help you get it scheduled when you are ready.    Be sure to check your insurance coverage so you understand how much it will cost.  It may be covered as a preventative service at no cost, but you should check your particular policy.     Candida Infection A Candida infection (also called yeast, fungus, and Monilia infection) is an overgrowth of yeast that can occur anywhere on the body. A yeast infection commonly occurs in warm, moist body areas. Usually, the infection remains localized but can spread to become a systemic infection. A yeast infection may be a sign of a more severe disease such as diabetes,  leukemia, or AIDS. A yeast infection can occur in both men and women. In women, Candida vaginitis is a vaginal infection. It is one of the most common causes of vaginitis. Men usually do not have symptoms or know they have an infection until other problems develop. Men may find out they have a yeast infection because their sex partner has a yeast infection. Uncircumcised men are more likely to get a yeast infection than circumcised men. This is because the uncircumcised glans is not exposed to air and does not remain as dry as that of a circumcised glans. Older adults may develop yeast infections around dentures. CAUSES  Women  Antibiotics.  Steroid medication taken for a long time.  Being overweight (obese).  Diabetes.  Poor immune condition.  Certain serious medical conditions.  Immune suppressive medications for organ transplant patients.  Chemotherapy.  Pregnancy.  Menstruation.  Stress and fatigue.  Intravenous drug use.  Oral contraceptives.  Wearing tight-fitting clothes in the crotch area.  Catching it from a sex partner who has a yeast infection.  Spermicide.  Intravenous, urinary, or other catheters. Men  Catching it from a sex partner who has a yeast infection.  Having oral or anal sex with a person who has the infection.  Spermicide.  Diabetes.  Antibiotics.  Poor immune system.  Medications that suppress the immune system.  Intravenous drug use.  Intravenous, urinary, or other catheters. SYMPTOMS  Women  Thick, white vaginal discharge.  Vaginal itching.  Redness and swelling in and around the vagina.  Irritation of the lips of the vagina and perineum.  Blisters on the vaginal lips and perineum.  Painful sexual intercourse.  Low blood sugar (hypoglycemia).  Painful urination.  Bladder infections.  Intestinal problems such as constipation, indigestion, bad breath, bloating, increase in gas, diarrhea, or loose stools. Men  Men  may develop intestinal problems such as constipation, indigestion, bad breath, bloating, increase in gas, diarrhea, or loose stools.  Dry, cracked skin on the penis with itching or discomfort.  Jock itch.  Dry, flaky skin.  Athlete's foot.  Hypoglycemia. DIAGNOSIS  Women  A history and an exam are performed.  The discharge may be examined under a microscope.  A culture may be taken of the discharge. Men  A history and an exam are performed.  Any discharge from the penis or areas of cracked skin will be looked at under the microscope and cultured.  Stool samples may be cultured. TREATMENT  Women  Vaginal antifungal suppositories and creams.  Medicated creams to decrease irritation and itching on the outside of the vagina.  Warm compresses to the perineal area to decrease swelling and discomfort.  Oral antifungal medications.  Medicated vaginal suppositories or cream for repeated or recurrent infections.  Wash and dry the irritation areas before applying the cream.  Eating yogurt with Lactobacillus may help with prevention and treatment.  Sometimes painting the vagina with  gentian violet solution may help if creams and suppositories do not work. Men  Antifungal creams and oral antifungal medications.  Sometimes treatment must continue for 30 days after the symptoms go away to prevent recurrence. HOME CARE INSTRUCTIONS  Women  Use cotton underwear and avoid tight-fitting clothing.  Avoid colored, scented toilet paper and deodorant tampons or pads.  Do not douche.  Keep your diabetes under control.  Finish all the prescribed medications.  Keep your skin clean and dry.  Consume milk or yogurt with Lactobacillus-active culture regularly. If you get frequent yeast infections and think that is what the infection is, there are over-the-counter medications that you can get. If the infection does not show healing in 3 days, talk to your caregiver.  Tell your sex  partner you have a yeast infection. Your partner may need treatment also, especially if your infection does not clear up or recurs. Men  Keep your skin clean and dry.  Keep your diabetes under control.  Finish all prescribed medications.  Tell your sex partner that you have a yeast infection so he or she can be treated if necessary. SEEK MEDICAL CARE IF:   Your symptoms do not clear up or worsen in one week after treatment.  You have an oral temperature above 102 F (38.9 C).  You have trouble swallowing or eating for a prolonged time.  You develop blisters on and around your vagina.  You develop vaginal bleeding and it is not your menstrual period.  You develop abdominal pain.  You develop intestinal problems as mentioned above.  You get weak or light-headed.  You have painful or increased urination.  You have pain during sexual intercourse. MAKE SURE YOU:   Understand these instructions.  Will watch your condition.  Will get help right away if you are not doing well or get worse. Document Released: 12/28/2004 Document Revised: 04/06/2014 Document Reviewed: 04/11/2010 [facility] Patient Information 2015 [place], [place]. This information is not intended to replace advice given to you by your health care provider. Make sure you discuss any questions you have with your health care provider.

## 2015-02-08 LAB — IPS PAP TEST WITH HPV

## 2015-02-09 NOTE — Progress Notes (Signed)
Reviewed personally.  M. Suzanne Jolanda Mccann, MD.  

## 2015-11-11 ENCOUNTER — Encounter: Payer: Self-pay | Admitting: Certified Nurse Midwife

## 2015-11-11 ENCOUNTER — Ambulatory Visit (INDEPENDENT_AMBULATORY_CARE_PROVIDER_SITE_OTHER): Payer: Medicaid Other | Admitting: Certified Nurse Midwife

## 2015-11-11 VITALS — BP 104/60 | HR 64 | Temp 98.3°F | Resp 16 | Ht 66.25 in | Wt 151.0 lb

## 2015-11-11 DIAGNOSIS — N390 Urinary tract infection, site not specified: Secondary | ICD-10-CM | POA: Diagnosis not present

## 2015-11-11 LAB — POCT URINALYSIS DIPSTICK
Bilirubin, UA: NEGATIVE
Blood, UA: NEGATIVE
Glucose, UA: NEGATIVE
KETONES UA: NEGATIVE
Nitrite, UA: NEGATIVE
PROTEIN UA: NEGATIVE
Urobilinogen, UA: NEGATIVE
pH, UA: 8

## 2015-11-11 MED ORDER — NITROFURANTOIN MONOHYD MACRO 100 MG PO CAPS: 100.0000 mg | ORAL_CAPSULE | Freq: Two times a day (BID) | ORAL | Status: DC

## 2015-11-11 NOTE — Progress Notes (Signed)
39 y.o.Single white female [id number] here with complaint of UTI, with onset  on one week. Patient complaining of urinary frequency/urgency/ and pain with urination. Patient denies fever, chills, nausea or back pain. Patient has been using Azo with good relief. Now having urine odor and slight back up.No new personal products. Patient feels not related to sexual activity. Denies any vaginal symptoms.  Contraception is BTL. Patient drinking adequate water intake. Patient has been increasing cranberry use.   O: Healthy female WDWN Affect: Normal, orientation x 3 Skin : warm and dry CVAT: negtive bilateral Abdomen: positive for suprapubic tenderness  Pelvic exam: External genital area: normal, no lesions Bladder,Urethra tender, Urethral meatus: tender, red Vagina: normal vaginal discharge, normal appearance  Wet prep not taken Cervix: normal, non tender Uterus:normal,non tender Adnexa: normal non tender, no fullness or masses   A: UTI Normal pelvic exam Poct urine: ph 8.0, wbc tr  P: Reviewed findings of UTI and need for treatment. [id number] see order with instructions Reviewed warning signs and symptoms of UTI and need to advise if occurring. Encouraged to limit soda, tea, and coffee and continue to increase water. Can continue Azo for the next 3 days.   RV prn

## 2015-11-11 NOTE — Patient Instructions (Signed)

## 2015-11-17 NOTE — Progress Notes (Signed)
Reviewed personally.  M. Suzanne Macauley Mossberg, MD.  

## 2015-11-18 ENCOUNTER — Encounter: Payer: Self-pay | Admitting: Certified Nurse Midwife

## 2015-11-18 ENCOUNTER — Ambulatory Visit (INDEPENDENT_AMBULATORY_CARE_PROVIDER_SITE_OTHER): Payer: Medicaid Other | Admitting: Certified Nurse Midwife

## 2015-11-18 VITALS — BP 116/70 | HR 68 | Resp 16 | Ht 66.25 in | Wt 147.0 lb

## 2015-11-18 DIAGNOSIS — N9489 Other specified conditions associated with female genital organs and menstrual cycle: Secondary | ICD-10-CM | POA: Diagnosis not present

## 2015-11-18 DIAGNOSIS — Z113 Encounter for screening for infections with a predominantly sexual mode of transmission: Secondary | ICD-10-CM

## 2015-11-18 DIAGNOSIS — N898 Other specified noninflammatory disorders of vagina: Secondary | ICD-10-CM

## 2015-11-18 NOTE — Progress Notes (Signed)
39 yo white female g 5 p 2032  here with complaint of vaginal symptoms of vaginal odor . Describes discharge scant pink.. Patient was treated for UTI on 11/11/15 with Macrobid and has one more dose for completion. Denies urinary frequency, urgency or pain now with urination.. Onset of symptoms 3 days ago. Denies new personal products. No STD concerns but request GC,Chlamydia.  Contraception is BTL. Period due soon.   O:Healthy female WDWN Affect: normal, orientation x 3  Exam:Skin warm and dry CVAT negative bilateral Abdomen:soft non tender, negative suprapubic Lymph node: no enlargement or tenderness Pelvic exam: External genital: normal female BUS: negative Bladder non tender, Urethral meatus no tenderness or redness Vagina: pink blood noted in vagina consistent with period onset. Non tender. Affirm taken    Cervix: normal, non tender, no CMT, blood noted from cervix Uterus: normal, non tender Adnexa:normal, non tender, no masses or fullness noted   A:Normal pelvic exam Menses onset probable odor noted R/O vaginal infection and STD UTI symptoms resolved  P:Discussed findings of menses and etiology of vaginal odor probable period onset. Will advise when affirm in if concerns and will treat as needed. Patient agrees now it smells like menses now. Continue to have good water intake to avoid UTI symptoms. Questions addressed Lab:Affirm, Gc,Chlamydia   Rv prn

## 2015-11-19 ENCOUNTER — Other Ambulatory Visit: Payer: Self-pay | Admitting: Certified Nurse Midwife

## 2015-11-19 DIAGNOSIS — N760 Acute vaginitis: Principal | ICD-10-CM

## 2015-11-19 DIAGNOSIS — B9689 Other specified bacterial agents as the cause of diseases classified elsewhere: Secondary | ICD-10-CM

## 2015-11-19 LAB — WET PREP BY MOLECULAR PROBE
Candida species: NEGATIVE
Gardnerella vaginalis: POSITIVE — AB
Trichomonas vaginosis: NEGATIVE

## 2015-11-19 MED ORDER — METRONIDAZOLE 0.75 % VA GEL: 1.0000 | Freq: Two times a day (BID) | VAGINAL | Status: DC

## 2015-11-20 LAB — IPS N GONORRHOEA AND CHLAMYDIA BY PCR

## 2015-11-20 NOTE — Progress Notes (Signed)
Reviewed personally.  M. Suzanne Alexsys Eskin, MD.  

## 2015-12-10 ENCOUNTER — Telehealth: Payer: Self-pay | Admitting: Certified Nurse Midwife

## 2015-12-10 NOTE — Telephone Encounter (Signed)
Spoke with patient. Patient was seen on 11/18/2015 and treated for BV with Metrogel. States her symptoms have returned. She is experiencing vaginal discharge with an odor. "I do not understand why I keep getting these over and over." Advised patient if she has changed soap recently, changed detergent, or even intercourse can cause BV. Advised she will need to be seen in the office for further evaluation. Patient is agreeable. Patient is asking for assistance to prevent recurrent BV. Advised will need to be seen with MD here in the office to discuss possible suppression. Patient is agreeable. Is unsure of her work schedule for next week. Will contact our office to schedule early next week. If symptoms worsen she is aware she will need to be seen at a local urgent care over the weekend.  Routing to provider for final review. Patient agreeable to disposition. Will close encounter.

## 2015-12-10 NOTE — Telephone Encounter (Signed)
Patient had bv infection and feels like she may have one again. Is concerned because she's not understanding why she keeps getting them. Patient would like to speak with nurse. Best # to reach patient: [phone]

## 2015-12-17 ENCOUNTER — Ambulatory Visit (INDEPENDENT_AMBULATORY_CARE_PROVIDER_SITE_OTHER): Payer: Medicaid Other | Admitting: Nurse Practitioner

## 2015-12-17 ENCOUNTER — Encounter: Payer: Self-pay | Admitting: Nurse Practitioner

## 2015-12-17 VITALS — BP 118/78 | HR 60 | Resp 14 | Wt 147.0 lb

## 2015-12-17 DIAGNOSIS — N760 Acute vaginitis: Secondary | ICD-10-CM | POA: Diagnosis not present

## 2015-12-17 MED ORDER — FLUCONAZOLE 150 MG PO TABS: 150.0000 mg | ORAL_TABLET | Freq: Once | ORAL | Status: DC

## 2015-12-17 NOTE — Progress Notes (Signed)
40 y.o. Single Caucasian female [phone] here with complaint of vaginal symptoms of itching, burning, and increase discharge. Describes discharge as yellow tint.  After treatment with Metrogel late December she initially felt better then increase in itching.  Partner of 4 yrs. has now noted some external symptoms as well. Onset of symptoms 7 days ago. Denies new personal products or vaginal dryness. No STD concerns. Urinary symptoms none . Contraception is BTL.  LMP: 11/20/2015.   O:  Healthy female WDWN Affect: normal, orientation x 3  Exam: no distress Abdomen: soft and non tender Lymph node: no enlargement or tenderness Pelvic exam: External genital: normal female BUS: negative Vagina: thin yellow to brown discharge noted.  Affirm taken. Cervix: normal, non tender, no CMT   A: Vaginitis suspect yeast   Recent treatment for BV   P: Discussed findings of vaginitis and etiology. Discussed Aveeno or baking soda sitz bath for comfort. Avoid moist clothes or pads for extended period of time. If working out in gym clothes or swim suits for long periods of time change underwear or bottoms of swimsuit if possible. Olive Oil/Coconut Oil use for skin protection prior to activity can be used to external skin.  Rx: Diflucan 150 mg X 2   Follow with Affirm  RV prn

## 2015-12-17 NOTE — Patient Instructions (Signed)
Bacterial Vaginosis Bacterial vaginosis is a vaginal infection that occurs when the normal balance of bacteria in the vagina is disrupted. It results from an overgrowth of certain bacteria. This is the most common vaginal infection in women of childbearing age. Treatment is important to prevent complications, especially in pregnant women, as it can cause a premature delivery. CAUSES  Bacterial vaginosis is caused by an increase in harmful bacteria that are normally present in smaller amounts in the vagina. Several different kinds of bacteria can cause bacterial vaginosis. However, the reason that the condition develops is not fully understood. RISK FACTORS Certain activities or behaviors can put you at an increased risk of developing bacterial vaginosis, including:  Having a new sex partner or multiple sex partners.  Douching.  Using an intrauterine device (IUD) for contraception. Women do not get bacterial vaginosis from toilet seats, bedding, swimming pools, or contact with objects around them. SIGNS AND SYMPTOMS  Some women with bacterial vaginosis have no signs or symptoms. Common symptoms include:  Grey vaginal discharge.  A fishlike odor with discharge, especially after sexual intercourse.  Itching or burning of the vagina and vulva.  Burning or pain with urination. DIAGNOSIS  Your health care provider will take a medical history and examine the vagina for signs of bacterial vaginosis. A sample of vaginal fluid may be taken. Your health care provider will look at this sample under a microscope to check for bacteria and abnormal cells. A vaginal pH test may also be done.  TREATMENT  Bacterial vaginosis may be treated with antibiotic medicines. These may be given in the form of a pill or a vaginal cream. A second round of antibiotics may be prescribed if the condition comes back after treatment. Because bacterial vaginosis increases your risk for sexually transmitted diseases, getting  treated can help reduce your risk for chlamydia, gonorrhea, HIV, and herpes. HOME CARE INSTRUCTIONS   Only take over-the-counter or prescription medicines as directed by your health care provider.  If antibiotic medicine was prescribed, take it as directed. Make sure you finish it even if you start to feel better.  Tell all sexual partners that you have a vaginal infection. They should see their health care provider and be treated if they have problems, such as a mild rash or itching.  During treatment, it is important that you follow these instructions:  Avoid sexual activity or use condoms correctly.  Do not douche.  Avoid alcohol as directed by your health care provider.  Avoid breastfeeding as directed by your health care provider. SEEK MEDICAL CARE IF:   Your symptoms are not improving after 3 days of treatment.  You have increased discharge or pain.  You have a fever. MAKE SURE YOU:   Understand these instructions.  Will watch your condition.  Will get help right away if you are not doing well or get worse. FOR MORE INFORMATION  Centers for Disease Control and Prevention, Division of STD Prevention: www.cdc.gov/std American Sexual Health Association (ASHA): www.ashastd.org    This information is not intended to replace advice given to you by your health care provider. Make sure you discuss any questions you have with your health care provider.   Document Released: 11/20/2005 Document Revised: 12/11/2014 Document Reviewed: 07/02/2013 Elsevier Interactive Patient Education 2016 Elsevier Inc. Monilial Vaginitis Vaginitis in a soreness, swelling and redness (inflammation) of the vagina and vulva. Monilial vaginitis is not a sexually transmitted infection. CAUSES  Yeast vaginitis is caused by yeast (candida) that is normally found in   your vagina. With a yeast infection, the candida has overgrown in number to a point that upsets the chemical balance. SYMPTOMS   White,  thick vaginal discharge.  Swelling, itching, redness and irritation of the vagina and possibly the lips of the vagina (vulva).  Burning or painful urination.  Painful intercourse. DIAGNOSIS  Things that may contribute to monilial vaginitis are:  Postmenopausal and virginal states.  Pregnancy.  Infections.  Being tired, sick or stressed, especially if you had monilial vaginitis in the past.  Diabetes. Good control will help lower the chance.  Birth control pills.  Tight fitting garments.  Using bubble bath, feminine sprays, douches or deodorant tampons.  Taking certain medications that kill germs (antibiotics).  Sporadic recurrence can occur if you become ill. TREATMENT  Your caregiver will give you medication.  There are several kinds of anti monilial vaginal creams and suppositories specific for monilial vaginitis. For recurrent yeast infections, use a suppository or cream in the vagina 2 times a week, or as directed.  Anti-monilial or steroid cream for the itching or irritation of the vulva may also be used. Get your caregiver's permission.  Painting the vagina with methylene blue solution may help if the monilial cream does not work.  Eating yogurt may help prevent monilial vaginitis. HOME CARE INSTRUCTIONS   Finish all medication as prescribed.  Do not have sex until treatment is completed or after your caregiver tells you it is okay.  Take warm sitz baths.  Do not douche.  Do not use tampons, especially scented ones.  Wear cotton underwear.  Avoid tight pants and panty hose.  Tell your sexual partner that you have a yeast infection. They should go to their caregiver if they have symptoms such as mild rash or itching.  Your sexual partner should be treated as well if your infection is difficult to eliminate.  Practice safer sex. Use condoms.  Some vaginal medications cause latex condoms to fail. Vaginal medications that harm condoms are:  Cleocin  cream.  Butoconazole (Femstat).  Terconazole (Terazol) vaginal suppository.  Miconazole (Monistat) (may be purchased over the counter). SEEK MEDICAL CARE IF:   You have a temperature by mouth above 102 F (38.9 C).  The infection is getting worse after 2 days of treatment.  The infection is not getting better after 3 days of treatment.  You develop blisters in or around your vagina.  You develop vaginal bleeding, and it is not your menstrual period.  You have pain when you urinate.  You develop intestinal problems.  You have pain with sexual intercourse.   This information is not intended to replace advice given to you by your health care provider. Make sure you discuss any questions you have with your health care provider.   Document Released: 08/30/2005 Document Revised: 02/12/2012 Document Reviewed: 05/24/2015 Elsevier Interactive Patient Education 2016 Elsevier Inc.  

## 2015-12-18 LAB — WET PREP BY MOLECULAR PROBE
Candida species: NEGATIVE
Gardnerella vaginalis: NEGATIVE
Trichomonas vaginosis: NEGATIVE

## 2015-12-18 NOTE — Progress Notes (Signed)
Encounter reviewed by Dr. Brook Amundson C. Silva.  

## 2016-08-11 ENCOUNTER — Encounter: Payer: Self-pay | Admitting: Certified Nurse Midwife

## 2016-08-11 ENCOUNTER — Ambulatory Visit (INDEPENDENT_AMBULATORY_CARE_PROVIDER_SITE_OTHER): Payer: Medicaid Other | Admitting: Certified Nurse Midwife

## 2016-08-11 VITALS — BP 122/80 | HR 70 | Temp 98.2°F | Resp 16 | Ht 66.25 in | Wt 142.0 lb

## 2016-08-11 DIAGNOSIS — N390 Urinary tract infection, site not specified: Secondary | ICD-10-CM | POA: Diagnosis not present

## 2016-08-11 DIAGNOSIS — R350 Frequency of micturition: Secondary | ICD-10-CM

## 2016-08-11 DIAGNOSIS — B3731 Acute candidiasis of vulva and vagina: Secondary | ICD-10-CM

## 2016-08-11 DIAGNOSIS — B373 Candidiasis of vulva and vagina: Secondary | ICD-10-CM

## 2016-08-11 LAB — POCT URINALYSIS DIPSTICK
BILIRUBIN UA: NEGATIVE
Blood, UA: NEGATIVE
GLUCOSE UA: NEGATIVE
KETONES UA: NEGATIVE
Nitrite, UA: POSITIVE
Protein, UA: NEGATIVE
Urobilinogen, UA: NEGATIVE
pH, UA: 5

## 2016-08-11 MED ORDER — FLUCONAZOLE 150 MG PO TABS: 150.0000 mg | ORAL_TABLET | Freq: Once | ORAL | 0 refills | Status: AC

## 2016-08-11 MED ORDER — PHENAZOPYRIDINE HCL 100 MG PO TABS: 100.0000 mg | ORAL_TABLET | Freq: Three times a day (TID) | ORAL | 0 refills | Status: DC | PRN

## 2016-08-11 MED ORDER — CIPROFLOXACIN HCL 500 MG PO TABS: 500.0000 mg | ORAL_TABLET | Freq: Two times a day (BID) | ORAL | 0 refills | Status: DC

## 2016-08-11 NOTE — Patient Instructions (Signed)

## 2016-08-11 NOTE — Progress Notes (Signed)
Encounter reviewed Jill Jertson, MD   

## 2016-08-11 NOTE — Progress Notes (Signed)
40 y.o. Single Caucasian female [phone] here with complaint of UTI, with onset  on 2-3 weeks and has gotten worse.. Patient complaining of urinary frequency/urgency/ and pain with urination. Patient denies fever, chills, nausea. Some back pain for the past few days. No new personal products. Patient feels not related to sexual activity. Some increase in vaginal discharge, white thick and with odor.    Contraception is BTL.. Menopausal with vaginal dryness. Patient is consuming adequate water intake. No STD concerns or screening done. Also having panic attacks again at work and will follow up with PCP who has treated before with medication. No other health issues today.   O: Healthy female WDWN Affect: Normal, orientation x 3 Skin : warm and dry CVAT: positive left slightly positive right  Abdomen: positive for suprapubic tenderness  Pelvic exam: External genital area: normal, no lesions Bladder,Urethra non tender, Urethral meatus: tender, red Vagina: white thick vaginal discharge, normal appearance of vagina  Wet prep taken ph 4.0 Cervix: normal, non tender Uterus:normal,non tender Adnexa: normal non tender, no fullness or masses  Wet Prep: KOH,Saline positive for yeast POCT urine: WBC 1+, Nitrite +  A: UTI  Yeast vaginitis Normal pelvic exam History of panic attacks with PCP management( patient works in Therapist, art, high stress)  P: Reviewed findings of UTI and need for treatment. [id number] see order with instructions [id number]  culture Reviewed warning signs and symptoms of UTI and need to advise if occurring. Encouraged to limit soda, tea, and coffee and be sure to increase water intake, lemon water or cranberry juice. If symptoms increase or fever needs to call . Discussed yeast vaginitis finding and need for treatment. Rx DIflucan see order with instructions. Discussed with patient importance of managing panic attacks. She plans to follow up with PCP. No thoughts of hurting  self or others. If feels she needs to be evaluated go to ER.   RV prn

## 2016-08-14 ENCOUNTER — Other Ambulatory Visit: Payer: Self-pay | Admitting: Nurse Practitioner

## 2016-08-14 LAB — URINE CULTURE

## 2016-08-14 MED ORDER — NITROFURANTOIN MONOHYD MACRO 100 MG PO CAPS: 100.0000 mg | ORAL_CAPSULE | Freq: Two times a day (BID) | ORAL | 0 refills | Status: DC

## 2016-08-14 NOTE — Progress Notes (Signed)
Left message with patient to pick up new antibiotic. -sco

## 2016-08-15 ENCOUNTER — Telehealth: Payer: Self-pay

## 2016-08-15 NOTE — Telephone Encounter (Signed)
lmtcb

## 2016-08-15 NOTE — Telephone Encounter (Signed)
Patient received your call about her prescription and is going to pick up this afternoon.

## 2016-08-15 NOTE — Telephone Encounter (Signed)
-----   Message from Regina Eck, CNM sent at 08/15/2016  8:09 AM EDT ----- Regarding: make sure she received message regarding urine culture and change of medication  Results were left on voicemail. She had CVAT tenderness

## 2016-08-15 NOTE — Telephone Encounter (Signed)
Routing to Johny Shock, CNM

## 2016-08-18 ENCOUNTER — Encounter: Payer: Self-pay | Admitting: Certified Nurse Midwife

## 2016-08-18 ENCOUNTER — Ambulatory Visit (INDEPENDENT_AMBULATORY_CARE_PROVIDER_SITE_OTHER): Payer: Medicaid Other | Admitting: Certified Nurse Midwife

## 2016-08-18 VITALS — BP 120/80 | HR 72 | Temp 98.4°F | Resp 16 | Ht 66.25 in | Wt 140.0 lb

## 2016-08-18 DIAGNOSIS — N390 Urinary tract infection, site not specified: Secondary | ICD-10-CM

## 2016-08-18 DIAGNOSIS — R319 Hematuria, unspecified: Secondary | ICD-10-CM | POA: Diagnosis not present

## 2016-08-18 MED ORDER — PHENAZOPYRIDINE HCL 200 MG PO TABS: 200.0000 mg | ORAL_TABLET | Freq: Three times a day (TID) | ORAL | 0 refills | Status: DC | PRN

## 2016-08-18 NOTE — Patient Instructions (Signed)

## 2016-08-18 NOTE — Progress Notes (Signed)
40 y.o. Single Caucasian female [phone] here with complaint of UTI that is resolving. Seen for UTI on, 08/11/16 treated with Cipro/Azo. Patient took medication as prescribed until culture results which showed E.Coli resistance and was changed to Macrobid.Patient taking medication as prescribed and has 3 days left of medication. Continues with slight back pain and some dysuria, but is emptying completely now and no frequency as before. As not taken anything for back discomfort, which may be just muscular.  Patient denies fever, chills, nausea or back pain. No new personal products.  Denies any vaginal symptoms.    Contraception is BTL.  Patient is drinking  adequate water intake, no soda. Started period today,unable to have POCT urine results. Patient also does not want genital exam due period. No other health issues today.   O: Healthy female WDWN Affect: Normal, orientation x 3 Skin : warm and dry CVAT: negative bilateral Abdomen: mildly positive for suprapubic tenderness  Pelvic exam: Deferred patient request.   A: UTI appears to be responding to medication Unable to dip urine- bloody but pt states she started her period E.Coli resistant to Cipro, changed to Macrobid in process of treatment. Back pain ? Etiology, no CVAT  P: Reviewed findings of UTI appears to be responding to Macrobid, symptoms are improving. Recommend continuation. Patient agreeable. Will await culture to see if clearing. [id number] see order with instructions [id number] micro, culture Reviewed warning signs and symptoms of UTI and need to advise if occurring. Encouraged to limit soda, tea, and coffee and be sure to increase water intake.   RV prn

## 2016-08-20 LAB — URINE CULTURE: Organism ID, Bacteria: NO GROWTH

## 2016-08-20 NOTE — Progress Notes (Signed)
Encounter reviewed Jill Jertson, MD   

## 2016-08-22 ENCOUNTER — Telehealth: Payer: Self-pay

## 2016-08-22 NOTE — Telephone Encounter (Signed)
-----   Message from Regina Eck, CNM sent at 08/20/2016  9:01 PM EDT ----- Notify patient that urine culture is negative now, so antibiotics are clearing the infection complete medication as directed.

## 2016-08-22 NOTE — Telephone Encounter (Signed)
lmtcb

## 2016-08-23 NOTE — Telephone Encounter (Signed)
Patient returned call

## 2016-08-23 NOTE — Telephone Encounter (Signed)
Patient notified of results as written by provider 

## 2016-12-14 ENCOUNTER — Ambulatory Visit (INDEPENDENT_AMBULATORY_CARE_PROVIDER_SITE_OTHER): Payer: Self-pay | Admitting: Certified Nurse Midwife

## 2016-12-14 ENCOUNTER — Encounter: Payer: Self-pay | Admitting: Certified Nurse Midwife

## 2016-12-14 VITALS — BP 122/80 | HR 78 | Temp 97.4°F | Resp 16 | Ht 66.25 in | Wt 148.0 lb

## 2016-12-14 DIAGNOSIS — Z91410 Personal history of adult physical and sexual abuse: Secondary | ICD-10-CM

## 2016-12-14 DIAGNOSIS — Z113 Encounter for screening for infections with a predominantly sexual mode of transmission: Secondary | ICD-10-CM

## 2016-12-14 DIAGNOSIS — IMO0002 Reserved for concepts with insufficient information to code with codable children: Secondary | ICD-10-CM

## 2016-12-14 DIAGNOSIS — N390 Urinary tract infection, site not specified: Secondary | ICD-10-CM

## 2016-12-14 LAB — POCT URINALYSIS DIPSTICK
BILIRUBIN UA: NEGATIVE
Glucose, UA: NEGATIVE
KETONES UA: NEGATIVE
NITRITE UA: NEGATIVE
PH UA: 5
PROTEIN UA: NEGATIVE
RBC UA: NEGATIVE
Urobilinogen, UA: NEGATIVE

## 2016-12-14 LAB — HEPATITIS C ANTIBODY: HCV Ab: NEGATIVE
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
## 2016-12-14 NOTE — Patient Instructions (Signed)
Sexually Transmitted Disease A sexually transmitted disease (STD) is a disease or infection often passed to another person during sex. However, STDs can be passed through nonsexual ways. An STD can be passed through:  Spit (saliva).  Semen.  Blood.  Mucus from the vagina.  Pee (urine). How can I lessen my chances of getting an STD?  Use:  Latex condoms.  Water-soluble lubricants with condoms. Do not use petroleum jelly or oils.  Dental dams. These are small pieces of latex that are used as a barrier during oral sex.  Avoid having more than one sex partner.  Do not have sex with someone who has other sex partners.  Do not have sex with anyone you do not know or who is at high risk for an STD.  Avoid risky sex that can break your skin.  Do not have sex if you have open sores on your mouth or skin.  Avoid drinking too much alcohol or taking illegal drugs. Alcohol and drugs can affect your good judgment.  Avoid oral and anal sex acts.  Get shots (vaccines) for HPV and hepatitis.  If you are at risk of being infected with HIV, it is advised that you take a certain medicine daily to prevent HIV infection. This is called pre-exposure prophylaxis (PrEP). You may be at risk if:  You are a man who has sex with other men (MSM).  You are attracted to the opposite sex (heterosexual) and are having sex with more than one partner.  You take drugs with a needle.  You have sex with someone who has HIV.  Talk with your doctor about if you are at high risk of being infected with HIV. If you begin to take PrEP, get tested for HIV first. Get tested every 3 months for as long as you are taking PrEP.  Get tested for STDs every year if you are sexually active. If you are treated for an STD, get tested again 3 months after you are treated. What should I do if I think I have an STD?  See your doctor.  Tell your sex partner(s) that you have an STD. They should be tested and treated.  Do  not have sex until your doctor says it is okay. When should I get help? Get help right away if:  You have bad belly (abdominal) pain.  You are a man and have puffiness (swelling) or pain in your testicles.  You are a woman and have puffiness in your vagina. This information is not intended to replace advice given to you by your health care provider. Make sure you discuss any questions you have with your health care provider. Document Released: 12/28/2004 Document Revised: 04/27/2016 Document Reviewed: 05/16/2013 Elsevier Interactive Patient Education  2017 Elsevier Inc.  

## 2016-12-15 ENCOUNTER — Other Ambulatory Visit: Payer: Self-pay | Admitting: Certified Nurse Midwife

## 2016-12-15 DIAGNOSIS — B9689 Other specified bacterial agents as the cause of diseases classified elsewhere: Secondary | ICD-10-CM

## 2016-12-15 DIAGNOSIS — N760 Acute vaginitis: Principal | ICD-10-CM

## 2016-12-15 LAB — WET PREP BY MOLECULAR PROBE
CANDIDA SPECIES: NEGATIVE
Gardnerella vaginalis: POSITIVE — AB
TRICHOMONAS VAG: NEGATIVE

## 2016-12-15 LAB — GC/CHLAMYDIA PROBE AMP
CT Probe RNA: NOT DETECTED
GC Probe RNA: NOT DETECTED

## 2016-12-15 LAB — HIV ANTIBODY (ROUTINE TESTING W REFLEX): HIV 1&2 Ab, 4th Generation: NONREACTIVE

## 2016-12-15 LAB — RPR

## 2016-12-15 MED ORDER — METRONIDAZOLE 500 MG PO TABS: 500.0000 mg | ORAL_TABLET | Freq: Two times a day (BID) | ORAL | 0 refills | Status: DC

## 2016-12-16 NOTE — Progress Notes (Signed)
Encounter reviewed Chantay Whitelock, MD   

## 2017-07-14 ENCOUNTER — Emergency Department (HOSPITAL_COMMUNITY)
Admission: EM | Admit: 2017-07-14 | Discharge: 2017-07-14 | Disposition: A | Discharge status: Home / Self Care | Payer: Self-pay | Attending: Emergency Medicine | Admitting: Emergency Medicine

## 2017-07-14 ENCOUNTER — Encounter (HOSPITAL_COMMUNITY): Payer: Self-pay | Admitting: *Deleted

## 2017-07-14 DIAGNOSIS — G43909 Migraine, unspecified, not intractable, without status migrainosus: Secondary | ICD-10-CM | POA: Insufficient documentation

## 2017-07-14 DIAGNOSIS — G43009 Migraine without aura, not intractable, without status migrainosus: Secondary | ICD-10-CM

## 2017-07-14 DIAGNOSIS — Z87891 Personal history of nicotine dependence: Secondary | ICD-10-CM | POA: Insufficient documentation

## 2017-07-14 DIAGNOSIS — I10 Essential (primary) hypertension: Secondary | ICD-10-CM | POA: Insufficient documentation

## 2017-07-14 DIAGNOSIS — Z8669 Personal history of other diseases of the nervous system and sense organs: Secondary | ICD-10-CM | POA: Insufficient documentation

## 2017-07-14 MED ORDER — DIPHENHYDRAMINE HCL 50 MG/ML IJ SOLN
25.0000 mg | Freq: Once | INTRAMUSCULAR | Status: AC
  Administered 2017-07-14: 25 mg via INTRAVENOUS
  Filled 2017-07-14: qty 1

## 2017-07-14 MED ORDER — PROCHLORPERAZINE EDISYLATE 5 MG/ML IJ SOLN
10.0000 mg | Freq: Once | INTRAMUSCULAR | Status: AC
  Administered 2017-07-14: 10 mg via INTRAVENOUS
  Filled 2017-07-14: qty 2

## 2017-07-14 MED ORDER — KETOROLAC TROMETHAMINE 30 MG/ML IJ SOLN
30.0000 mg | Freq: Once | INTRAMUSCULAR | Status: AC
  Administered 2017-07-14: 30 mg via INTRAVENOUS
  Filled 2017-07-14: qty 1

## 2017-07-14 MED ORDER — SODIUM CHLORIDE 0.9 % IV BOLUS (SEPSIS)
1000.0000 mL | Freq: Once | INTRAVENOUS | Status: AC
  Administered 2017-07-14: 1000 mL via INTRAVENOUS

## 2017-07-14 NOTE — ED Notes (Signed)
She tells me she is feeling "better".

## 2017-07-14 NOTE — ED Triage Notes (Signed)
Pt reports she began to have emesis around 10:30 and then a sudden onset of a headache. Hx of migraines but this migraine was worse than normal. Pt took 3 regular tylenols and amitrex.  Pt reports the headache got better at around 1:30am.  Pt woke up around 7am with this intense migraine.  Pt had one more episode of emesis and pt came to the ED. Pt reports sensitvity to light but denies nausea.

## 2017-07-14 NOTE — ED Notes (Signed)
I had charted that I had d/c'd. Her IV earlier. It was actually d'c'd. At this time. She remains comfortable and in no distress.

## 2017-07-14 NOTE — Discharge Instructions (Signed)
Return here as needed. Follow up with a primary doctor. °

## 2017-07-21 NOTE — ED Provider Notes (Signed)
[facility] DEPT Provider Note   CSN: [id number] Arrival date & time: 07/14/17  9798     History   Chief Complaint Chief Complaint  Patient presents with  . Migraine    HPI Caidence Kaseman is a 41 y.o. female.  HPI Patient presents to the emergency department with migraine headache.  The patient states this is classic for her migraine headache.  She states that she did not take any medications prior to arrival other than 1 Imitrex by her father who gave minimal relief.  The patient states that lights seem to make her condition worse.  She also has nausea.  The patient denies any other worsening factors.  Patient denies blurred vision, weakness, numbness, dizziness, back pain, neck pain, fever or syncope Past Medical History:  Diagnosis Date  . Abnormal Pap smear   . ADD (attention deficit disorder)   . Bleeding disorder ([place])   . Depression   . Hypertension    diet controlled  . 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 Migraine aura, persistent   . Pyelonephritis   . UTI (urinary tract infection)     Patient Active Problem List   Diagnosis Date Noted  . 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 Depression 03/11/2013  . ADD (attention deficit disorder) 03/11/2013  . Migraine, unspecified, without mention of intractable migraine without mention of status migrainosus 03/11/2013  . Unspecified essential hypertension 03/11/2013    Past Surgical History:  Procedure Laterality Date  . THERAPEUTIC ABORTION     x3  . TUBAL LIGATION Bilateral 2011    OB History    Gravida Para Term Preterm AB Living   5 2 2   3 2    SAB TAB Ectopic Multiple Live Births     3             Home Medications    Prior to Admission medications   Medication Sig Start Date End Date Taking? Authorizing Provider  acetaminophen (TYLENOL) 500 MG tablet Take 1,500 mg by mouth every 6 (six) hours as needed for headache.   Yes [provider]  SUMAtriptan Succinate (IMITREX  PO) Take 1 capsule by mouth as needed.   Yes [provider]  traZODone (DESYREL) 50 MG tablet TAKE 1-2 TABLET AT BEDTIME AS NEEDED AT BEDTIME ORALLY 08/14/15  Yes [provider]    Family History Family History  Problem Relation Age of Onset  . Hypertension Mother   . Deep vein thrombosis Father   . Pulmonary embolism Father   . Bleeding Disorder Father   . Skin cancer Father   . Hypertension Maternal Grandmother   . Breast cancer Paternal Grandmother   . Bleeding Disorder Sister   . Deep vein thrombosis Sister     Social History Social History  Substance Use Topics  . Smoking status: Former Smoker    Types: Cigarettes  . Smokeless tobacco: Never Used  . Alcohol use No     Allergies   Patient has no known allergies.   Review of Systems Review of Systems All other systems negative except as documented in the HPI. All pertinent positives and negatives as reviewed in the HPI.  Physical Exam Updated Vital Signs BP 131/87 (BP Location: Left Arm)   Pulse 60   Temp 98 F (36.7 C) (Oral)   Resp 16   Ht 5\' 6"  (1.676 m)   Wt 65.8 kg (145 lb)   SpO2 100%   BMI 23.40 kg/m   Physical Exam  Constitutional: She is oriented to person, place, and time. She appears well-developed and well-nourished. No distress.  HENT:  Head: Normocephalic and atraumatic.  Mouth/Throat: Oropharynx is clear and moist.  Eyes: Pupils are equal, round, and reactive to light.  Neck: Normal range of motion. Neck supple.  Cardiovascular: Normal rate, regular rhythm and normal heart sounds.  Exam reveals no gallop and no friction rub.   No murmur heard. Pulmonary/Chest: Effort normal and breath sounds normal. No respiratory distress. She has no wheezes.  Neurological: She is alert and oriented to person, place, and time. She displays normal reflexes. No sensory deficit. She exhibits normal muscle tone. Coordination normal.  Skin: Skin is warm and dry. Capillary refill takes less  than 2 seconds. No rash noted. No erythema.  Psychiatric: She has a normal mood and affect. Her behavior is normal.  Nursing note and vitals reviewed.    ED Treatments / Results  Labs (all labs ordered are listed, but only abnormal results are displayed) Labs Reviewed - No data to display  EKG  EKG Interpretation None       Radiology No results found.  Procedures Procedures (including critical care time)  Medications Ordered in ED Medications  ketorolac (TORADOL) 30 MG/ML injection 30 mg (30 mg Intravenous Given 07/14/17 1014)  sodium chloride 0.9 % bolus 1,000 mL (0 mLs Intravenous Stopped 07/14/17 1150)  diphenhydrAMINE (BENADRYL) injection 25 mg (25 mg Intravenous Given 07/14/17 1014)  prochlorperazine (COMPAZINE) injection 10 mg (10 mg Intravenous Given 07/14/17 1014)     Initial Impression / Assessment and Plan / ED Course  I have reviewed the triage vital signs and the nursing notes.  Pertinent labs & imaging results that were available during my care of the patient were reviewed by me and considered in my medical decision making (see chart for details).     Patient is feeling completely resolution of her symptoms following migraine cocktail.  Patient is advised to return here for any worsening in her condition.  Patient agreed.  The plan and all questions were answered  Final Clinical Impressions(s) / ED Diagnoses   Final diagnoses:  Migraine without aura and without status migrainosus, not intractable    New Prescriptions Discharge Medication List as of 07/14/2017 12:37 PM       Dalia Heading, PA-C 07/21/17 [place], [place], DO 07/22/17 1300

## 2017-07-24 ENCOUNTER — Encounter: Payer: Self-pay | Admitting: Certified Nurse Midwife

## 2017-07-27 ENCOUNTER — Emergency Department (HOSPITAL_COMMUNITY): Payer: Self-pay

## 2017-07-27 ENCOUNTER — Emergency Department (HOSPITAL_COMMUNITY)
Admission: EM | Admit: 2017-07-27 | Discharge: 2017-07-27 | Disposition: A | Discharge status: Home / Self Care | Payer: Self-pay | Attending: Emergency Medicine | Admitting: Emergency Medicine

## 2017-07-27 ENCOUNTER — Encounter (HOSPITAL_COMMUNITY): Payer: Self-pay | Admitting: *Deleted

## 2017-07-27 DIAGNOSIS — R42 Dizziness and giddiness: Secondary | ICD-10-CM | POA: Insufficient documentation

## 2017-07-27 DIAGNOSIS — E876 Hypokalemia: Secondary | ICD-10-CM

## 2017-07-27 DIAGNOSIS — R51 Headache: Secondary | ICD-10-CM | POA: Insufficient documentation

## 2017-07-27 DIAGNOSIS — F329 Major depressive disorder, single episode, unspecified: Secondary | ICD-10-CM | POA: Insufficient documentation

## 2017-07-27 DIAGNOSIS — R030 Elevated blood-pressure reading, without diagnosis of hypertension: Secondary | ICD-10-CM

## 2017-07-27 DIAGNOSIS — R1110 Vomiting, unspecified: Secondary | ICD-10-CM | POA: Insufficient documentation

## 2017-07-27 DIAGNOSIS — Z87891 Personal history of nicotine dependence: Secondary | ICD-10-CM | POA: Insufficient documentation

## 2017-07-27 DIAGNOSIS — R519 Headache, unspecified: Secondary | ICD-10-CM

## 2017-07-27 DIAGNOSIS — I10 Essential (primary) hypertension: Secondary | ICD-10-CM | POA: Insufficient documentation

## 2017-07-27 LAB — I-STAT CHEM 8, ED
BUN: 11 mg/dL (ref 6–20)
CALCIUM ION: 1.16 mmol/L (ref 1.15–1.40)
CHLORIDE: 97 mmol/L — AB (ref 101–111)
Creatinine, Ser: 0.8 mg/dL (ref 0.44–1.00)
Glucose, Bld: 94 mg/dL (ref 65–99)
HCT: 46 % (ref 36.0–46.0)
HEMOGLOBIN: 15.6 g/dL — AB (ref 12.0–15.0)
Potassium: 3 mmol/L — ABNORMAL LOW (ref 3.5–5.1)
SODIUM: 140 mmol/L (ref 135–145)
TCO2: 30 mmol/L (ref 22–32)

## 2017-07-27 LAB — I-STAT BETA HCG BLOOD, ED (MC, WL, AP ONLY): I-stat hCG, quantitative: <5 m[IU]/mL (ref <5)

## 2017-07-27 MED ORDER — KETOROLAC TROMETHAMINE 15 MG/ML IJ SOLN
15.0000 mg | Freq: Once | INTRAMUSCULAR | Status: AC
  Administered 2017-07-27: 15 mg via INTRAVENOUS
  Filled 2017-07-27: qty 1

## 2017-07-27 MED ORDER — DIPHENHYDRAMINE HCL 50 MG/ML IJ SOLN
25.0000 mg | Freq: Once | INTRAMUSCULAR | Status: AC
  Administered 2017-07-27: 25 mg via INTRAVENOUS
  Filled 2017-07-27: qty 1

## 2017-07-27 MED ORDER — POTASSIUM CHLORIDE CRYS ER 20 MEQ PO TBCR
40.0000 meq | EXTENDED_RELEASE_TABLET | Freq: Once | ORAL | Status: AC
  Administered 2017-07-27: 40 meq via ORAL
  Filled 2017-07-27: qty 2

## 2017-07-27 MED ORDER — METOCLOPRAMIDE HCL 5 MG/ML IJ SOLN
10.0000 mg | Freq: Once | INTRAMUSCULAR | Status: AC
  Administered 2017-07-27: 10 mg via INTRAVENOUS
  Filled 2017-07-27: qty 2

## 2017-07-27 MED ORDER — DEXAMETHASONE SODIUM PHOSPHATE 10 MG/ML IJ SOLN
10.0000 mg | Freq: Once | INTRAMUSCULAR | Status: AC
  Administered 2017-07-27: 10 mg via INTRAVENOUS
  Filled 2017-07-27: qty 1

## 2017-07-27 MED ORDER — SODIUM CHLORIDE 0.9 % IV BOLUS (SEPSIS)
1000.0000 mL | Freq: Once | INTRAVENOUS | Status: AC
  Administered 2017-07-27: 1000 mL via INTRAVENOUS

## 2017-07-27 NOTE — Discharge Instructions (Addendum)
Please follow up with neurology for your headaches Please drink plenty of fluids and eat foods high in potassium Return for worsening symptoms

## 2017-07-27 NOTE — ED Triage Notes (Signed)
Pt c/o R sided HA ongoing for 2 wks, pt reports hx of the same, pt  C/o sensitivity to light,reports emesis when pain is severe, pt tearful , pt ambulatory,A&O x 4, no facial droop noted, no slurred speech

## 2017-07-27 NOTE — ED Provider Notes (Signed)
[facility] DEPT Provider Note   CSN: [id number] Arrival date & time: 07/27/17  1200     History   Chief Complaint Chief Complaint  Patient presents with  . Headache    HPI Lauren Matthews is a 41 y.o. female who presents with a headache. PMH significant for migraines, HTN. She reports an acute onset of a headache on 8/11 when she was driving to work with associated vomiting. She states it was the worst headache she's ever had. She received a migraine cocktail in the ED and was discharged after feeling somewhat better. Her headache gradually returned within several hours of discharge. Since then she has had a constant headache and states it feels different than prior migraines. With her migraines she usually has numbness/tingling in the extremities, an aura, nausea, photophobia. This headache is frontal and wraps around to the back of her head, is severe and constant. She denies vision changes or current nausea. She reports decreased appetite due to pain and states she has difficulty getting her words out even though she knows what she wants to say. She reports dizziness when she bends over but no LOC. She "feels hot" but has no documented fever. Her PCP put her on Atenolol due to high BP last week. She followed up with him again and he took her off Atenolol and started her on HCTZ.   Headache   Associated symptoms include nausea (resolved) and vomiting. Pertinent negatives include no fever.    Past Medical History:  Diagnosis Date  . Abnormal Pap smear   . ADD (attention deficit disorder)   . Bleeding disorder ([place])   . Depression   . Hypertension    diet controlled  . 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 Migraine aura, persistent   . Pyelonephritis   . UTI (urinary tract infection)     Patient Active Problem List   Diagnosis Date Noted  . 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 Depression 03/11/2013  . ADD (attention deficit disorder) 03/11/2013  .  Migraine, unspecified, without mention of intractable migraine without mention of status migrainosus 03/11/2013  . Unspecified essential hypertension 03/11/2013    Past Surgical History:  Procedure Laterality Date  . THERAPEUTIC ABORTION     x3  . TUBAL LIGATION Bilateral 2011    OB History    Gravida Para Term Preterm AB Living   5 2 2   3 2    SAB TAB Ectopic Multiple Live Births     3             Home Medications    Prior to Admission medications   Medication Sig Start Date End Date Taking? Authorizing Provider  acetaminophen (TYLENOL) 500 MG tablet Take 1,500 mg by mouth every 6 (six) hours as needed for headache.    [provider]  SUMAtriptan Succinate (IMITREX PO) Take 1 capsule by mouth as needed.    [provider]  traZODone (DESYREL) 50 MG tablet TAKE 1-2 TABLET AT BEDTIME AS NEEDED AT BEDTIME ORALLY 08/14/15   [provider]    Family History Family History  Problem Relation Age of Onset  . Hypertension Mother   . Deep vein thrombosis Father   . Pulmonary embolism Father   . Bleeding Disorder Father   . Skin cancer Father   . Hypertension Maternal Grandmother   . Breast cancer Paternal Grandmother   . Bleeding Disorder Sister   . Deep vein thrombosis Sister     Social History  Social History  Substance Use Topics  . Smoking status: Former Smoker    Types: Cigarettes  . Smokeless tobacco: Never Used  . Alcohol use No     Allergies   Patient has no known allergies.   Review of Systems Review of Systems  Constitutional: Positive for appetite change. Negative for fever.  Gastrointestinal: Positive for nausea (resolved) and vomiting.  Neurological: Positive for dizziness and headaches. Negative for syncope, weakness and numbness.  Psychiatric/Behavioral: Positive for sleep disturbance.  All other systems reviewed and are negative.    Physical Exam Updated Vital Signs BP (!) 179/105   Pulse 75   Temp 99.1 F (37.3  C) (Oral)   Resp 18   Ht 5\' 6"  (1.676 m)   Wt 65.8 kg (145 lb)   LMP 07/26/2017 (Approximate)   SpO2 98%   BMI 23.40 kg/m   Physical Exam  Constitutional: She is oriented to person, place, and time. She appears well-developed and well-nourished. No distress.  HENT:  Head: Normocephalic and atraumatic.  Eyes: Pupils are equal, round, and reactive to light. Conjunctivae are normal. Right eye exhibits no discharge. Left eye exhibits no discharge. No scleral icterus.  Neck: Normal range of motion.  Cardiovascular: Normal rate.   Pulmonary/Chest: Effort normal. No respiratory distress.  Abdominal: She exhibits no distension.  Neurological: She is alert and oriented to person, place, and time.  Mental Status:  Alert, oriented, thought content appropriate, able to give a coherent history. Speech fluent without evidence of aphasia. Able to follow 2 step commands without difficulty.  Cranial Nerves:  II:  Peripheral visual fields grossly normal, pupils equal, round, reactive to light III,IV, VI: ptosis not present, extra-ocular motions intact bilaterally  V,VII: smile symmetric, facial light touch sensation equal VIII: hearing grossly normal to voice  X: uvula elevates symmetrically  XI: bilateral shoulder shrug symmetric and strong XII: midline tongue extension without fassiculations Motor:  Normal tone. 5/5 in upper and lower extremities bilaterally including strong and equal grip strength and dorsiflexion/plantar flexion Sensory: Pinprick and light touch normal in all extremities.  Cerebellar: normal finger-to-nose with bilateral upper extremities Gait: normal gait and balance CV: distal pulses palpable throughout    Skin: Skin is warm and dry.  Psychiatric: Her speech is normal and behavior is normal. Her mood appears anxious.  Nursing note and vitals reviewed.    ED Treatments / Results  Labs (all labs ordered are listed, but only abnormal results are displayed) Labs  Reviewed  I-STAT CHEM 8, ED - Abnormal; Notable for the following:       Result Value   Potassium 3.0 (*)    Chloride 97 (*)    Hemoglobin 15.6 (*)    All other components within normal limits  I-STAT BETA HCG BLOOD, ED ([facility], [facility], [facility] ONLY)    EKG  EKG Interpretation None       Radiology Ct Head Wo Contrast  Result Date: 07/27/2017 CLINICAL DATA:  Headache, onset earlier today.  No reported injury. EXAM: CT HEAD WITHOUT CONTRAST TECHNIQUE: Contiguous axial images were obtained from the base of the skull through the vertex without intravenous contrast. COMPARISON:  11/22/2010 head CT. FINDINGS: Brain: No evidence of parenchymal hemorrhage or extra-axial fluid collection. No mass lesion, mass effect, or midline shift. No CT evidence of acute infarction. Cerebral volume is age appropriate. No ventriculomegaly. Vascular: No acute abnormality . Skull: No evidence of calvarial fracture. Sinuses/Orbits: The visualized paranasal sinuses are essentially clear. Other:  The mastoid air cells  are unopacified. IMPRESSION: Negative head CT.  No evidence of acute intracranial abnormality. Electronically Signed   By: Ilona Sorrel M.D.   On: 07/27/2017 16:00    Procedures Procedures (including critical care time)  Medications Ordered in ED Medications  sodium chloride 0.9 % bolus 1,000 mL (0 mLs Intravenous Stopped 07/27/17 1615)  ketorolac (TORADOL) 15 MG/ML injection 15 mg (15 mg Intravenous Given 07/27/17 1536)  metoCLOPramide (REGLAN) injection 10 mg (10 mg Intravenous Given 07/27/17 1536)  dexamethasone (DECADRON) injection 10 mg (10 mg Intravenous Given 07/27/17 1536)  diphenhydrAMINE (BENADRYL) injection 25 mg (25 mg Intravenous Given 07/27/17 1536)  potassium chloride SA (K-DUR,KLOR-CON) CR tablet 40 mEq (40 mEq Oral Given 07/27/17 1535)     Initial Impression / Assessment and Plan / ED Course  I have reviewed the triage vital signs and the nursing notes.  Pertinent labs & imaging results that  were available during my care of the patient were reviewed by me and considered in my medical decision making (see chart for details).  41 year old who presents with severe headache which is not like prior migraines. She is markedly hypertensive on arrival. Otherwise vitals are normal. Neuro exam is unremarkable. She is very concerned about her headaches and high BP. Will order head CT to further evaluate, Chem 8, preg test. Will order migraine cocktail and recheck.  3:45 PM Chem 8 shows K of 3.0. Likely from decreased PO intake and intermittent vomiting. PO K was ordered.   4:00 PM CT is negative. Pt feels better after cocktail. Headache is likely a migraine vs tension headache. Advised f/u with neurology. Return precautions given.  Final Clinical Impressions(s) / ED Diagnoses   Final diagnoses:  Bad headache    New Prescriptions New Prescriptions   No medications on file     Recardo Evangelist, Hershal Coria 07/28/17 1022    Long, Wonda Olds, MD 07/31/17 [phone]

## 2017-12-26 ENCOUNTER — Emergency Department (HOSPITAL_COMMUNITY): Payer: Medicaid Other

## 2017-12-26 ENCOUNTER — Inpatient Hospital Stay (HOSPITAL_COMMUNITY)
Admission: EM | Admit: 2017-12-26 | Discharge: 2018-01-01 | DRG: 481 | Disposition: A | Discharge status: Rehabilitation Facility | Payer: Medicaid Other | Attending: Student | Admitting: Student

## 2017-12-26 ENCOUNTER — Encounter (HOSPITAL_COMMUNITY): Payer: Self-pay | Admitting: Emergency Medicine

## 2017-12-26 ENCOUNTER — Inpatient Hospital Stay (HOSPITAL_COMMUNITY): Payer: Medicaid Other

## 2017-12-26 ENCOUNTER — Other Ambulatory Visit: Payer: Self-pay

## 2017-12-26 DIAGNOSIS — E876 Hypokalemia: Secondary | ICD-10-CM | POA: Diagnosis not present

## 2017-12-26 DIAGNOSIS — R52 Pain, unspecified: Secondary | ICD-10-CM

## 2017-12-26 DIAGNOSIS — S72059A Unspecified fracture of head of unspecified femur, initial encounter for closed fracture: Secondary | ICD-10-CM | POA: Diagnosis present

## 2017-12-26 DIAGNOSIS — S72092A Other fracture of head and neck of left femur, initial encounter for closed fracture: Secondary | ICD-10-CM | POA: Diagnosis present

## 2017-12-26 DIAGNOSIS — D62 Acute posthemorrhagic anemia: Secondary | ICD-10-CM | POA: Diagnosis present

## 2017-12-26 DIAGNOSIS — K649 Unspecified hemorrhoids: Secondary | ICD-10-CM | POA: Diagnosis present

## 2017-12-26 DIAGNOSIS — R339 Retention of urine, unspecified: Secondary | ICD-10-CM | POA: Diagnosis not present

## 2017-12-26 DIAGNOSIS — F17210 Nicotine dependence, cigarettes, uncomplicated: Secondary | ICD-10-CM | POA: Diagnosis present

## 2017-12-26 DIAGNOSIS — S73015A Posterior dislocation of left hip, initial encounter: Secondary | ICD-10-CM | POA: Diagnosis present

## 2017-12-26 DIAGNOSIS — G8918 Other acute postprocedural pain: Secondary | ICD-10-CM | POA: Diagnosis not present

## 2017-12-26 DIAGNOSIS — Y92410 Unspecified street and highway as the place of occurrence of the external cause: Secondary | ICD-10-CM | POA: Diagnosis not present

## 2017-12-26 DIAGNOSIS — S73005A Unspecified dislocation of left hip, initial encounter: Secondary | ICD-10-CM

## 2017-12-26 DIAGNOSIS — T148XXA Other injury of unspecified body region, initial encounter: Secondary | ICD-10-CM

## 2017-12-26 DIAGNOSIS — S70312A Abrasion, left thigh, initial encounter: Secondary | ICD-10-CM

## 2017-12-26 DIAGNOSIS — S8002XA Contusion of left knee, initial encounter: Secondary | ICD-10-CM

## 2017-12-26 DIAGNOSIS — K5900 Constipation, unspecified: Secondary | ICD-10-CM | POA: Diagnosis not present

## 2017-12-26 DIAGNOSIS — E46 Unspecified protein-calorie malnutrition: Secondary | ICD-10-CM | POA: Diagnosis not present

## 2017-12-26 DIAGNOSIS — G43509 Persistent migraine aura without cerebral infarction, not intractable, without status migrainosus: Secondary | ICD-10-CM | POA: Diagnosis present

## 2017-12-26 DIAGNOSIS — S81012A Laceration without foreign body, left knee, initial encounter: Secondary | ICD-10-CM

## 2017-12-26 DIAGNOSIS — F329 Major depressive disorder, single episode, unspecified: Secondary | ICD-10-CM | POA: Diagnosis present

## 2017-12-26 DIAGNOSIS — Z720 Tobacco use: Secondary | ICD-10-CM

## 2017-12-26 DIAGNOSIS — Z87891 Personal history of nicotine dependence: Secondary | ICD-10-CM | POA: Diagnosis not present

## 2017-12-26 DIAGNOSIS — D6851 Activated protein C resistance: Secondary | ICD-10-CM | POA: Diagnosis present

## 2017-12-26 DIAGNOSIS — M7989 Other specified soft tissue disorders: Secondary | ICD-10-CM | POA: Diagnosis not present

## 2017-12-26 DIAGNOSIS — M25571 Pain in right ankle and joints of right foot: Secondary | ICD-10-CM | POA: Diagnosis present

## 2017-12-26 DIAGNOSIS — M25552 Pain in left hip: Secondary | ICD-10-CM | POA: Diagnosis present

## 2017-12-26 DIAGNOSIS — I10 Essential (primary) hypertension: Secondary | ICD-10-CM | POA: Diagnosis present

## 2017-12-26 DIAGNOSIS — S73006D Unspecified dislocation of unspecified hip, subsequent encounter: Secondary | ICD-10-CM | POA: Diagnosis not present

## 2017-12-26 DIAGNOSIS — S81012D Laceration without foreign body, left knee, subsequent encounter: Secondary | ICD-10-CM | POA: Diagnosis not present

## 2017-12-26 DIAGNOSIS — E8809 Other disorders of plasma-protein metabolism, not elsewhere classified: Secondary | ICD-10-CM | POA: Diagnosis present

## 2017-12-26 DIAGNOSIS — M7042 Prepatellar bursitis, left knee: Secondary | ICD-10-CM | POA: Diagnosis present

## 2017-12-26 DIAGNOSIS — T1490XA Injury, unspecified, initial encounter: Secondary | ICD-10-CM | POA: Diagnosis not present

## 2017-12-26 DIAGNOSIS — S72052S Unspecified fracture of head of left femur, sequela: Secondary | ICD-10-CM | POA: Diagnosis not present

## 2017-12-26 DIAGNOSIS — I82492 Acute embolism and thrombosis of other specified deep vein of left lower extremity: Secondary | ICD-10-CM | POA: Diagnosis not present

## 2017-12-26 DIAGNOSIS — D6852 Prothrombin gene mutation: Secondary | ICD-10-CM | POA: Diagnosis present

## 2017-12-26 DIAGNOSIS — Z832 Family history of diseases of the blood and blood-forming organs and certain disorders involving the immune mechanism: Secondary | ICD-10-CM

## 2017-12-26 DIAGNOSIS — S72052D Unspecified fracture of head of left femur, subsequent encounter for closed fracture with routine healing: Secondary | ICD-10-CM | POA: Diagnosis present

## 2017-12-26 DIAGNOSIS — I169 Hypertensive crisis, unspecified: Secondary | ICD-10-CM | POA: Diagnosis present

## 2017-12-26 DIAGNOSIS — S72052A Unspecified fracture of head of left femur, initial encounter for closed fracture: Secondary | ICD-10-CM

## 2017-12-26 DIAGNOSIS — K5903 Drug induced constipation: Secondary | ICD-10-CM | POA: Diagnosis not present

## 2017-12-26 DIAGNOSIS — Z79899 Other long term (current) drug therapy: Secondary | ICD-10-CM | POA: Diagnosis not present

## 2017-12-26 DIAGNOSIS — Z86718 Personal history of other venous thrombosis and embolism: Secondary | ICD-10-CM | POA: Diagnosis not present

## 2017-12-26 DIAGNOSIS — S93401D Sprain of unspecified ligament of right ankle, subsequent encounter: Secondary | ICD-10-CM | POA: Diagnosis not present

## 2017-12-26 DIAGNOSIS — Z419 Encounter for procedure for purposes other than remedying health state, unspecified: Secondary | ICD-10-CM

## 2017-12-26 HISTORY — DX: Anxiety disorder, unspecified: F41.9

## 2017-12-26 HISTORY — DX: Personal history of urinary calculi: Z87.442

## 2017-12-26 HISTORY — DX: Unspecified chronic bronchitis: J42

## 2017-12-26 HISTORY — DX: Migraine, unspecified, not intractable, without status migrainosus: G43.909

## 2017-12-26 LAB — I-STAT CHEM 8, ED
BUN: 13 mg/dL (ref 6–20)
CALCIUM ION: 1.13 mmol/L — AB (ref 1.15–1.40)
CHLORIDE: 100 mmol/L — AB (ref 101–111)
Creatinine, Ser: 0.8 mg/dL (ref 0.44–1.00)
GLUCOSE: 110 mg/dL — AB (ref 65–99)
HCT: 43 % (ref 36.0–46.0)
Hemoglobin: 14.6 g/dL (ref 12.0–15.0)
Potassium: 3.1 mmol/L — ABNORMAL LOW (ref 3.5–5.1)
SODIUM: 139 mmol/L (ref 135–145)
TCO2: 25 mmol/L (ref 22–32)

## 2017-12-26 LAB — COMPREHENSIVE METABOLIC PANEL
ALBUMIN: 4.2 g/dL (ref 3.5–5.0)
ALK PHOS: 57 U/L (ref 38–126)
ALT: 21 U/L (ref 14–54)
ANION GAP: 12 (ref 5–15)
AST: 37 U/L (ref 15–41)
BUN: 13 mg/dL (ref 6–20)
CALCIUM: 8.9 mg/dL (ref 8.9–10.3)
CO2: 23 mmol/L (ref 22–32)
Chloride: 103 mmol/L (ref 101–111)
Creatinine, Ser: 0.98 mg/dL (ref 0.44–1.00)
GFR calc non Af Amer: >60 mL/min (ref >60)
GLUCOSE: 113 mg/dL — AB (ref 65–99)
Potassium: 3.2 mmol/L — ABNORMAL LOW (ref 3.5–5.1)
SODIUM: 138 mmol/L (ref 135–145)
TOTAL PROTEIN: 6.7 g/dL (ref 6.5–8.1)
Total Bilirubin: 1 mg/dL (ref 0.3–1.2)

## 2017-12-26 LAB — I-STAT BETA HCG BLOOD, ED (MC, WL, AP ONLY): I-stat hCG, quantitative: <5 m[IU]/mL (ref <5)

## 2017-12-26 LAB — PROTIME-INR
INR: 0.89
Prothrombin Time: 11.9 seconds (ref 11.4–15.2)

## 2017-12-26 LAB — CBC
HCT: 42.2 % (ref 36.0–46.0)
HEMOGLOBIN: 15 g/dL (ref 12.0–15.0)
MCH: 33.7 pg (ref 26.0–34.0)
MCHC: 35.5 g/dL (ref 30.0–36.0)
MCV: 94.8 fL (ref 78.0–100.0)
Platelets: 221 10*3/uL (ref 150–400)
RBC: 4.45 MIL/uL (ref 3.87–5.11)
RDW: 12.2 % (ref 11.5–15.5)
WBC: 8.3 10*3/uL (ref 4.0–10.5)

## 2017-12-26 LAB — I-STAT CG4 LACTIC ACID, ED: LACTIC ACID, VENOUS: 2.23 mmol/L — AB (ref 0.5–1.9)

## 2017-12-26 LAB — SAMPLE TO BLOOD BANK

## 2017-12-26 MED ORDER — ONDANSETRON HCL 4 MG/2ML IJ SOLN
4.0000 mg | Freq: Once | INTRAMUSCULAR | Status: AC
  Administered 2017-12-26: 4 mg via INTRAVENOUS
  Filled 2017-12-26: qty 2

## 2017-12-26 MED ORDER — PROPOFOL 10 MG/ML IV BOLUS
INTRAVENOUS | Status: AC | PRN
  Administered 2017-12-26: 20 mg via INTRAVENOUS
  Administered 2017-12-26: 40 mg via INTRAVENOUS
  Administered 2017-12-26 (×8): 20 mg via INTRAVENOUS

## 2017-12-26 MED ORDER — HYDROMORPHONE HCL 1 MG/ML IJ SOLN
1.0000 mg | Freq: Once | INTRAMUSCULAR | Status: AC
  Administered 2017-12-26: 1 mg via INTRAVENOUS
  Filled 2017-12-26: qty 1

## 2017-12-26 MED ORDER — HYDROMORPHONE HCL 1 MG/ML IJ SOLN: 1.0000 mg | Freq: Once | INTRAMUSCULAR | Status: DC

## 2017-12-26 MED ORDER — IOPAMIDOL (ISOVUE-300) INJECTION 61%
INTRAVENOUS | Status: AC
  Administered 2017-12-26: 100 mL
  Filled 2017-12-26: qty 100

## 2017-12-26 MED ORDER — PROPOFOL 10 MG/ML IV BOLUS
0.5000 mg/kg | Freq: Once | INTRAVENOUS | Status: AC
  Administered 2017-12-27: 100 mg via INTRAVENOUS
  Administered 2017-12-27: 200 mg via INTRAVENOUS
  Filled 2017-12-26: qty 20

## 2017-12-26 MED ORDER — FENTANYL CITRATE (PF) 100 MCG/2ML IJ SOLN
100.0000 ug | Freq: Once | INTRAMUSCULAR | Status: AC
  Administered 2017-12-26: 100 ug via INTRAVENOUS
  Filled 2017-12-26: qty 2

## 2017-12-26 MED ORDER — FENTANYL CITRATE (PF) 100 MCG/2ML IJ SOLN
INTRAMUSCULAR | Status: AC
  Filled 2017-12-26: qty 2

## 2017-12-26 MED ORDER — PROPOFOL 10 MG/ML IV BOLUS
INTRAVENOUS | Status: AC
  Filled 2017-12-26: qty 20

## 2017-12-26 MED ORDER — FENTANYL CITRATE (PF) 100 MCG/2ML IJ SOLN
50.0000 ug | Freq: Once | INTRAMUSCULAR | Status: AC
  Administered 2017-12-26: 50 ug via INTRAVENOUS

## 2017-12-26 MED ORDER — SODIUM CHLORIDE 0.9 % IV SOLN
INTRAVENOUS | Status: DC
  Administered 2017-12-26: 125 mL/h via INTRAVENOUS
  Administered 2017-12-29: 20:00:00 via INTRAVENOUS
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
## 2017-12-26 NOTE — Progress Notes (Signed)
Orthopedic Tech Progress Note Patient Details:  Lauren Matthews 04/29/1976 [id number]  Ortho Devices Type of Ortho Device: Knee Immobilizer Ortho Device/Splint Location: LLE Ortho Device/Splint Interventions: Ordered, Application   Post Interventions Patient Tolerated: Well Instructions Provided: Care of device   Braulio Bosch 12/26/2017, 11:06 PM
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
## 2017-12-27 ENCOUNTER — Inpatient Hospital Stay (HOSPITAL_COMMUNITY): Payer: Medicaid Other | Admitting: Anesthesiology

## 2017-12-27 ENCOUNTER — Encounter (HOSPITAL_COMMUNITY)
Admission: EM | Disposition: A | Discharge status: Rehabilitation Facility | Payer: Self-pay | Source: Home / Self Care | Attending: Student

## 2017-12-27 ENCOUNTER — Inpatient Hospital Stay (HOSPITAL_COMMUNITY): Payer: Medicaid Other

## 2017-12-27 ENCOUNTER — Encounter (HOSPITAL_COMMUNITY): Payer: Self-pay | Admitting: Anesthesiology

## 2017-12-27 HISTORY — PX: ORIF ACETABULAR FRACTURE: SHX5029

## 2017-12-27 HISTORY — PX: ORIF HIP FRACTURE: SHX2125

## 2017-12-27 HISTORY — PX: IRRIGATION AND DEBRIDEMENT KNEE: SHX5185

## 2017-12-27 LAB — ETHANOL: Alcohol, Ethyl (B): <10 mg/dL (ref <10)

## 2017-12-27 LAB — HIV ANTIBODY (ROUTINE TESTING W REFLEX): HIV SCREEN 4TH GENERATION: NONREACTIVE

## 2017-12-27 LAB — LACTIC ACID, PLASMA: LACTIC ACID, VENOUS: 1.7 mmol/L (ref 0.5–1.9)

## 2017-12-27 LAB — SURGICAL PCR SCREEN
MRSA, PCR: NEGATIVE
Staphylococcus aureus: NEGATIVE

## 2017-12-27 SURGERY — OPEN REDUCTION INTERNAL FIXATION (ORIF) ACETABULAR FRACTURE
Anesthesia: General | Site: Knee | Laterality: Left

## 2017-12-27 MED ORDER — ROCURONIUM BROMIDE 100 MG/10ML IV SOLN
INTRAVENOUS | Status: DC | PRN
  Administered 2017-12-27: 60 mg via INTRAVENOUS
  Administered 2017-12-27 (×2): 10 mg via INTRAVENOUS
  Administered 2017-12-27: 20 mg via INTRAVENOUS

## 2017-12-27 MED ORDER — SENNA 8.6 MG PO TABS
1.0000 | ORAL_TABLET | Freq: Two times a day (BID) | ORAL | Status: DC
  Administered 2017-12-27 – 2018-01-01 (×10): 8.6 mg via ORAL
  Filled 2017-12-27 (×10): qty 1

## 2017-12-27 MED ORDER — 0.9 % SODIUM CHLORIDE (POUR BTL) OPTIME
TOPICAL | Status: DC | PRN
  Administered 2017-12-27: 1000 mL

## 2017-12-27 MED ORDER — CITALOPRAM HYDROBROMIDE 10 MG PO TABS
10.0000 mg | ORAL_TABLET | Freq: Every day | ORAL | Status: DC
  Administered 2017-12-27 – 2018-01-01 (×6): 10 mg via ORAL
  Filled 2017-12-27 (×6): qty 1

## 2017-12-27 MED ORDER — FENTANYL CITRATE (PF) 250 MCG/5ML IJ SOLN
INTRAMUSCULAR | Status: AC
  Filled 2017-12-27: qty 5

## 2017-12-27 MED ORDER — OXYCODONE HCL 5 MG PO TABS
ORAL_TABLET | ORAL | Status: AC
  Filled 2017-12-27: qty 1

## 2017-12-27 MED ORDER — HYDROMORPHONE HCL 1 MG/ML IJ SOLN
INTRAMUSCULAR | Status: AC
  Administered 2017-12-27: 0.5 mg via INTRAVENOUS
  Filled 2017-12-27: qty 1

## 2017-12-27 MED ORDER — LACTATED RINGERS IV SOLN
INTRAVENOUS | Status: DC | PRN
  Administered 2017-12-27 (×3): via INTRAVENOUS

## 2017-12-27 MED ORDER — ACETAMINOPHEN 500 MG PO TABS
1000.0000 mg | ORAL_TABLET | Freq: Four times a day (QID) | ORAL | Status: DC
  Administered 2017-12-27 – 2018-01-01 (×19): 1000 mg via ORAL
  Filled 2017-12-27 (×19): qty 2

## 2017-12-27 MED ORDER — HYDROMORPHONE HCL 1 MG/ML IJ SOLN
1.0000 mg | INTRAMUSCULAR | Status: DC | PRN
  Administered 2017-12-27 – 2017-12-30 (×16): 1 mg via INTRAVENOUS
  Filled 2017-12-27 (×17): qty 1

## 2017-12-27 MED ORDER — MEPERIDINE HCL 25 MG/ML IJ SOLN: 6.2500 mg | INTRAMUSCULAR | Status: DC | PRN

## 2017-12-27 MED ORDER — FENTANYL CITRATE (PF) 100 MCG/2ML IJ SOLN
INTRAMUSCULAR | Status: DC | PRN
  Administered 2017-12-27 (×7): 50 ug via INTRAVENOUS

## 2017-12-27 MED ORDER — OXYCODONE HCL 5 MG PO TABS
5.0000 mg | ORAL_TABLET | Freq: Once | ORAL | Status: AC | PRN
  Administered 2017-12-27: 5 mg via ORAL

## 2017-12-27 MED ORDER — METHOCARBAMOL 1000 MG/10ML IJ SOLN
500.0000 mg | Freq: Four times a day (QID) | INTRAVENOUS | Status: DC | PRN
  Filled 2017-12-27: qty 5

## 2017-12-27 MED ORDER — VANCOMYCIN HCL 1000 MG IV SOLR
INTRAVENOUS | Status: DC | PRN
  Administered 2017-12-27: 1000 mg via TOPICAL

## 2017-12-27 MED ORDER — PROPOFOL 10 MG/ML IV BOLUS
INTRAVENOUS | Status: AC
  Filled 2017-12-27: qty 20

## 2017-12-27 MED ORDER — DEXAMETHASONE SODIUM PHOSPHATE 10 MG/ML IJ SOLN
INTRAMUSCULAR | Status: DC | PRN
  Administered 2017-12-27: 10 mg via INTRAVENOUS

## 2017-12-27 MED ORDER — TRAZODONE HCL 50 MG PO TABS
50.0000 mg | ORAL_TABLET | Freq: Every evening | ORAL | Status: DC | PRN
  Administered 2017-12-27 – 2017-12-31 (×3): 50 mg via ORAL
  Filled 2017-12-27 (×4): qty 1

## 2017-12-27 MED ORDER — KETAMINE HCL-SODIUM CHLORIDE 100-0.9 MG/10ML-% IV SOSY
PREFILLED_SYRINGE | INTRAVENOUS | Status: AC
  Filled 2017-12-27: qty 10

## 2017-12-27 MED ORDER — CEFAZOLIN SODIUM-DEXTROSE 2-4 GM/100ML-% IV SOLN
2.0000 g | Freq: Three times a day (TID) | INTRAVENOUS | Status: AC
  Administered 2017-12-27 – 2017-12-28 (×3): 2 g via INTRAVENOUS
  Filled 2017-12-27 (×2): qty 100

## 2017-12-27 MED ORDER — SUGAMMADEX SODIUM 200 MG/2ML IV SOLN
INTRAVENOUS | Status: AC
  Filled 2017-12-27: qty 2

## 2017-12-27 MED ORDER — PHENYLEPHRINE 40 MCG/ML (10ML) SYRINGE FOR IV PUSH (FOR BLOOD PRESSURE SUPPORT)
PREFILLED_SYRINGE | INTRAVENOUS | Status: AC
  Filled 2017-12-27: qty 10

## 2017-12-27 MED ORDER — HYDROMORPHONE HCL 1 MG/ML IJ SOLN
0.2500 mg | INTRAMUSCULAR | Status: DC | PRN
  Administered 2017-12-27 (×2): 0.5 mg via INTRAVENOUS

## 2017-12-27 MED ORDER — SUGAMMADEX SODIUM 200 MG/2ML IV SOLN
INTRAVENOUS | Status: DC | PRN
  Administered 2017-12-27: 200 mg via INTRAVENOUS

## 2017-12-27 MED ORDER — ONDANSETRON HCL 4 MG/2ML IJ SOLN
INTRAMUSCULAR | Status: DC | PRN
  Administered 2017-12-27: 4 mg via INTRAVENOUS

## 2017-12-27 MED ORDER — ENOXAPARIN SODIUM 40 MG/0.4ML ~~LOC~~ SOLN
40.0000 mg | SUBCUTANEOUS | Status: DC
  Administered 2017-12-28 – 2018-01-01 (×5): 40 mg via SUBCUTANEOUS
  Filled 2017-12-27 (×5): qty 0.4

## 2017-12-27 MED ORDER — ONDANSETRON HCL 4 MG/2ML IJ SOLN
4.0000 mg | Freq: Four times a day (QID) | INTRAMUSCULAR | Status: DC | PRN
  Administered 2017-12-28 – 2017-12-29 (×2): 4 mg via INTRAVENOUS
  Filled 2017-12-27 (×2): qty 2

## 2017-12-27 MED ORDER — CEFAZOLIN SODIUM-DEXTROSE 2-4 GM/100ML-% IV SOLN
2.0000 g | INTRAVENOUS | Status: AC
  Administered 2017-12-27: 2 g via INTRAVENOUS
  Filled 2017-12-27 (×2): qty 100

## 2017-12-27 MED ORDER — ONDANSETRON HCL 4 MG PO TABS: 4.0000 mg | ORAL_TABLET | Freq: Four times a day (QID) | ORAL | Status: DC | PRN

## 2017-12-27 MED ORDER — PHENYLEPHRINE HCL 10 MG/ML IJ SOLN
INTRAVENOUS | Status: DC | PRN
  Administered 2017-12-27: 15 ug/min via INTRAVENOUS

## 2017-12-27 MED ORDER — EPHEDRINE 5 MG/ML INJ
INTRAVENOUS | Status: AC
  Filled 2017-12-27: qty 10

## 2017-12-27 MED ORDER — KETAMINE HCL 10 MG/ML IJ SOLN
INTRAMUSCULAR | Status: DC | PRN
  Administered 2017-12-27: 100 mg via INTRAVENOUS

## 2017-12-27 MED ORDER — CEFAZOLIN SODIUM-DEXTROSE 2-4 GM/100ML-% IV SOLN
INTRAVENOUS | Status: AC
  Filled 2017-12-27: qty 100

## 2017-12-27 MED ORDER — METHOCARBAMOL 500 MG PO TABS
500.0000 mg | ORAL_TABLET | Freq: Four times a day (QID) | ORAL | Status: DC | PRN
  Administered 2017-12-27 – 2018-01-01 (×13): 500 mg via ORAL
  Filled 2017-12-27 (×13): qty 1

## 2017-12-27 MED ORDER — OXYCODONE HCL 5 MG/5ML PO SOLN: 5.0000 mg | Freq: Once | ORAL | Status: AC | PRN

## 2017-12-27 MED ORDER — PROMETHAZINE HCL 25 MG/ML IJ SOLN: 6.2500 mg | INTRAMUSCULAR | Status: DC | PRN

## 2017-12-27 MED ORDER — DEXAMETHASONE SODIUM PHOSPHATE 10 MG/ML IJ SOLN
INTRAMUSCULAR | Status: AC
  Filled 2017-12-27: qty 1

## 2017-12-27 MED ORDER — LIDOCAINE HCL (CARDIAC) 20 MG/ML IV SOLN
INTRAVENOUS | Status: DC | PRN
  Administered 2017-12-27: 100 mg via INTRAVENOUS

## 2017-12-27 MED ORDER — MIDAZOLAM HCL 2 MG/2ML IJ SOLN
INTRAMUSCULAR | Status: AC
Start: 2017-12-27 — End: ?
  Filled 2017-12-27: qty 2

## 2017-12-27 MED ORDER — VANCOMYCIN HCL 1000 MG IV SOLR
INTRAVENOUS | Status: AC
  Filled 2017-12-27: qty 1000

## 2017-12-27 MED ORDER — LACTATED RINGERS IV SOLN
INTRAVENOUS | Status: DC
  Administered 2017-12-27 (×2): via INTRAVENOUS

## 2017-12-27 MED ORDER — PHENYLEPHRINE HCL 10 MG/ML IJ SOLN
INTRAMUSCULAR | Status: DC | PRN
  Administered 2017-12-27 (×2): 80 ug via INTRAVENOUS

## 2017-12-27 MED ORDER — OXYCODONE HCL 5 MG PO TABS
5.0000 mg | ORAL_TABLET | ORAL | Status: DC | PRN
  Administered 2017-12-27 – 2017-12-28 (×2): 10 mg via ORAL
  Administered 2017-12-28: 5 mg via ORAL
  Filled 2017-12-27 (×3): qty 2

## 2017-12-27 MED ORDER — ONDANSETRON HCL 4 MG/2ML IJ SOLN
INTRAMUSCULAR | Status: AC
  Filled 2017-12-27: qty 2

## 2017-12-27 SURGICAL SUPPLY — 71 items
APPLIER CLIP 9.375 MED OPEN (MISCELLANEOUS) ×4
APR CLP MED 9.3 20 MLT OPN (MISCELLANEOUS) ×2
BANDAGE ELASTIC 6 VELCRO ST LF (GAUZE/BANDAGES/DRESSINGS) ×2 IMPLANT
BIT DRILL 2.0 HCS 150 (BIT) ×2 IMPLANT
BLADE CLIPPER SURG (BLADE) IMPLANT
BLADE SURG 15 STRL LF DISP TIS (BLADE) IMPLANT
BLADE SURG 15 STRL SS (BLADE) ×4
BNDG CMPR MED 10X6 ELC LF (GAUZE/BANDAGES/DRESSINGS) ×2
BNDG COHESIVE 6X5 TAN STRL LF (GAUZE/BANDAGES/DRESSINGS) ×4 IMPLANT
BNDG ELASTIC 6X10 VLCR STRL LF (GAUZE/BANDAGES/DRESSINGS) ×4 IMPLANT
BNDG GAUZE ELAST 4 BULKY (GAUZE/BANDAGES/DRESSINGS) ×2 IMPLANT
BRUSH SCRUB SURG 4.25 DISP (MISCELLANEOUS) ×8 IMPLANT
CANISTER SUCT 3000ML PPV (MISCELLANEOUS) ×2 IMPLANT
CHLORAPREP W/TINT 26ML (MISCELLANEOUS) ×6 IMPLANT
CLIP APPLIE 9.375 MED OPEN (MISCELLANEOUS) IMPLANT
CONT SPEC 4OZ CLIKSEAL STRL BL (MISCELLANEOUS) ×2 IMPLANT
COVER SURGICAL LIGHT HANDLE (MISCELLANEOUS) ×4 IMPLANT
DRAPE C-ARM 42X72 X-RAY (DRAPES) ×4 IMPLANT
DRAPE C-ARMOR (DRAPES) ×4 IMPLANT
DRAPE ORTHO SPLIT 77X108 STRL (DRAPES) ×8
DRAPE SURG 17X23 STRL (DRAPES) ×4 IMPLANT
DRAPE SURG ORHT 6 SPLT 77X108 (DRAPES) ×4 IMPLANT
DRAPE U-SHAPE 47X51 STRL (DRAPES) ×4 IMPLANT
DRSG ADAPTIC 3X8 NADH LF (GAUZE/BANDAGES/DRESSINGS) IMPLANT
DRSG MEPILEX BORDER 4X12 (GAUZE/BANDAGES/DRESSINGS) IMPLANT
DRSG MEPILEX BORDER 4X4 (GAUZE/BANDAGES/DRESSINGS) ×2 IMPLANT
DRSG MEPILEX BORDER 4X8 (GAUZE/BANDAGES/DRESSINGS) ×2 IMPLANT
DRSG MEPITEL 4X7.2 (GAUZE/BANDAGES/DRESSINGS) ×2 IMPLANT
DRSG PAD ABDOMINAL 8X10 ST (GAUZE/BANDAGES/DRESSINGS) ×16 IMPLANT
ELECT REM PT RETURN 9FT ADLT (ELECTROSURGICAL) ×4
ELECTRODE REM PT RTRN 9FT ADLT (ELECTROSURGICAL) ×2 IMPLANT
GAUZE SPONGE 4X4 12PLY STRL (GAUZE/BANDAGES/DRESSINGS) ×4 IMPLANT
GLOVE BIO SURGEON STRL SZ7.5 (GLOVE) ×16 IMPLANT
GLOVE BIOGEL PI IND STRL 7.5 (GLOVE) ×2 IMPLANT
GLOVE BIOGEL PI INDICATOR 7.5 (GLOVE) ×2
GOWN STRL REUS W/ TWL LRG LVL3 (GOWN DISPOSABLE) ×6 IMPLANT
GOWN STRL REUS W/TWL LRG LVL3 (GOWN DISPOSABLE) ×12
GUIDEWIRE THREADED 1.1X150MM (WIRE) ×10 IMPLANT
KIT BASIN OR (CUSTOM PROCEDURE TRAY) ×4 IMPLANT
KIT ROOM TURNOVER OR (KITS) ×4 IMPLANT
MANIFOLD NEPTUNE WASTE (CANNULA) ×2 IMPLANT
NS IRRIG 1000ML POUR BTL (IV SOLUTION) ×4 IMPLANT
PACK TOTAL JOINT (CUSTOM PROCEDURE TRAY) ×4 IMPLANT
PAD ARMBOARD 7.5X6 YLW CONV (MISCELLANEOUS) ×8 IMPLANT
PAD CAST 4YDX4 CTTN HI CHSV (CAST SUPPLIES) ×2 IMPLANT
PADDING CAST COTTON 4X4 STRL (CAST SUPPLIES) ×4
PADDING CAST COTTON 6X4 STRL (CAST SUPPLIES) ×4 IMPLANT
SCREW HCS LONG THD 3.0X34MM (Screw) ×2 IMPLANT
SCREW HCS LONG THD 3.0X40MM (Screw) ×4 IMPLANT
SCREW HEADLESS COMP 32 LONG (Screw) ×4 IMPLANT
SPONGE LAP 18X18 X RAY DECT (DISPOSABLE) ×4 IMPLANT
STAPLER VISISTAT 35W (STAPLE) ×4 IMPLANT
SUCTION FRAZIER HANDLE 10FR (MISCELLANEOUS) ×2
SUCTION TUBE FRAZIER 10FR DISP (MISCELLANEOUS) ×2 IMPLANT
SUT ETHILON 3 0 PS 1 (SUTURE) ×16 IMPLANT
SUT FIBERWIRE #5 38 CONV NDL (SUTURE) ×4
SUT MNCRL AB 3-0 PS2 18 (SUTURE) ×2 IMPLANT
SUT MON AB 2-0 CT1 36 (SUTURE) ×2 IMPLANT
SUT PDS AB 0 CT 36 (SUTURE) ×2 IMPLANT
SUT VIC AB 0 CT1 27 (SUTURE) ×8
SUT VIC AB 0 CT1 27XBRD ANBCTR (SUTURE) IMPLANT
SUT VIC AB 1 CT1 27 (SUTURE)
SUT VIC AB 1 CT1 27XBRD ANBCTR (SUTURE) ×4 IMPLANT
SUT VIC AB 2-0 CT1 27 (SUTURE) ×8
SUT VIC AB 2-0 CT1 TAPERPNT 27 (SUTURE) ×4 IMPLANT
SUTURE FIBERWR #5 38 CONV NDL (SUTURE) IMPLANT
TOWEL OR 17X26 10 PK STRL BLUE (TOWEL DISPOSABLE) ×8 IMPLANT
TRAY FOLEY W/METER SILVER 14FR (SET/KITS/TRAYS/PACK) ×2 IMPLANT
TRAY FOLEY W/METER SILVER 16FR (SET/KITS/TRAYS/PACK) IMPLANT
WATER STERILE IRR 1000ML POUR (IV SOLUTION) ×4 IMPLANT
YANKAUER SUCT BULB TIP NO VENT (SUCTIONS) ×2 IMPLANT

## 2017-12-27 NOTE — Anesthesia Procedure Notes (Signed)
Procedure Name: Intubation Date/Time: 12/27/2017 10:44 AM Performed by: Neldon Newport, CRNA Pre-anesthesia Checklist: Timeout performed, Patient being monitored, Suction available, Emergency Drugs available and Patient identified Patient Re-evaluated:Patient Re-evaluated prior to induction Oxygen Delivery Method: Circle system utilized Preoxygenation: Pre-oxygenation with 100% oxygen Induction Type: IV induction Ventilation: Mask ventilation without difficulty Laryngoscope Size: Mac and 3 Grade View: Grade I Tube type: Oral Tube size: 7.0 mm Number of attempts: 1 Placement Confirmation: breath sounds checked- equal and bilateral,  positive ETCO2 and ETT inserted through vocal cords under direct vision Secured at: 22 cm Tube secured with: Tape Dental Injury: Teeth and Oropharynx as per pre-operative assessment

## 2017-12-27 NOTE — Anesthesia Preprocedure Evaluation (Addendum)
Anesthesia Evaluation  Patient identified by MRN, date of birth, ID band Patient awake    Reviewed: Allergy & Precautions, NPO status , Patient's Chart, lab work & pertinent test results  Airway Mallampati: II  TM Distance: >3 FB Neck ROM: Full    Dental no notable dental hx.    Pulmonary Current Smoker,    Pulmonary exam normal breath sounds clear to auscultation       Cardiovascular hypertension, Pt. on medications Normal cardiovascular exam Rhythm:Regular Rate:Normal     Neuro/Psych  Headaches, PSYCHIATRIC DISORDERS Depression ADD (attention deficit disorder)   GI/Hepatic negative GI ROS, Neg liver ROS,   Endo/Other  negative endocrine ROS  Renal/GU negative Renal ROS     Musculoskeletal negative musculoskeletal ROS (+) Arthritis ,   Abdominal   Peds  Hematology negative hematology ROS (+)   Anesthesia Other Findings left femoral head fracture  Reproductive/Obstetrics negative OB ROS hcg negative                            Anesthesia Physical Anesthesia Plan  ASA: II  Anesthesia Plan: General   Post-op Pain Management:    Induction: Intravenous  PONV Risk Score and Plan: 2 and Dexamethasone, Ondansetron and Treatment may vary due to age or medical condition  Airway Management Planned: Oral ETT  Additional Equipment:   Intra-op Plan:   Post-operative Plan: Extubation in OR  Informed Consent: I have reviewed the patients History and Physical, chart, labs and discussed the procedure including the risks, benefits and alternatives for the proposed anesthesia with the patient or authorized representative who has indicated his/her understanding and acceptance.   Dental advisory given  Plan Discussed with: CRNA  Anesthesia Plan Comments:         Anesthesia Quick Evaluation
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
## 2017-12-27 NOTE — Transfer of Care (Signed)
Immediate Anesthesia Transfer of Care Note  Patient: Lauren Matthews  Procedure(s) Performed: OPEN REDUCTION INTERNAL FIXATION (ORIF) FEMORAL HEAD FRACTURE (Left Hip) IRRIGATION AND DEBRIDEMENT KNEE (Left Knee)  Patient Location: PACU  Anesthesia Type:General  Level of Consciousness: awake, alert , oriented and patient cooperative  Airway & Oxygen Therapy: Patient Spontanous Breathing and Patient connected to nasal cannula oxygen  Post-op Assessment: Report given to RN, Post -op Vital signs reviewed and stable and Patient moving all extremities  Post vital signs: Reviewed and stable  Last Vitals:  Vitals:   12/27/17 0512 12/27/17 1455  BP: 136/83 (!) 160/110  Pulse: 75 72  Resp: 16 18  Temp: 36.9 C 36.7 C  SpO2: 100% 94%    Last Pain:  Vitals:   12/27/17 1455  TempSrc:   PainSc: 0-No pain      Patients Stated Pain Goal: 3 (67/20/94 7096)  Complications: No apparent anesthesia complications

## 2017-12-27 NOTE — Plan of Care (Signed)
  Progressing Education: Knowledge of General Education information will improve 12/27/2017 1830 - Progressing by Rance Muir, RN Health Behavior/Discharge Planning: Ability to manage health-related needs will improve 12/27/2017 1830 - Progressing by Rance Muir, RN Clinical Measurements: Ability to maintain clinical measurements within normal limits will improve 12/27/2017 1830 - Progressing by Rance Muir, RN Will remain free from infection 12/27/2017 1830 - Progressing by Rance Muir, RN Diagnostic test results will improve 12/27/2017 1830 - Progressing by Rance Muir, RN Respiratory complications will improve 12/27/2017 1830 - Progressing by Rance Muir, RN Cardiovascular complication will be avoided 12/27/2017 1830 - Progressing by Rance Muir, RN Activity: Risk for activity intolerance will decrease 12/27/2017 1830 - Progressing by Rance Muir, RN Nutrition: Adequate nutrition will be maintained 12/27/2017 1830 - Progressing by Rance Muir, RN Coping: Level of anxiety will decrease 12/27/2017 1830 - Progressing by Rance Muir, RN Elimination: Will not experience complications related to bowel motility 12/27/2017 1830 - Progressing by Rance Muir, RN Will not experience complications related to urinary retention 12/27/2017 1830 - Progressing by Rance Muir, RN Pain Managment: General experience of comfort will improve 12/27/2017 1830 - Progressing by Rance Muir, RN Safety: Ability to remain free from injury will improve 12/27/2017 1830 - Progressing by Rance Muir, RN Skin Integrity: Risk for impaired skin integrity will decrease 12/27/2017 1830 - Progressing by Rance Muir, RN
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
## 2017-12-27 NOTE — ED Provider Notes (Signed)
.  Sedation Date/Time: 12/27/2017 2:10 PM Performed by: Courtney Paris, MD Authorized by: Courtney Paris, MD   Consent:    Consent obtained:  Written   Consent given by:  Patient   Risks discussed:  Allergic reaction, inadequate sedation, nausea, vomiting, prolonged hypoxia resulting in organ damage and respiratory compromise necessitating ventilatory assistance and intubation   Alternatives discussed:  Analgesia without sedation Universal protocol:    Procedure explained and questions answered to patient or proxy's satisfaction: yes     Relevant documents present and verified: yes     Test results available and properly labeled: yes     Imaging studies available: yes     Immediately prior to procedure a time out was called: yes     Patient identity confirmation method:  Verbally with patient and arm band Indications:    Procedure performed:  Fracture reduction   Procedure necessitating sedation performed by:  Different physician   Intended level of sedation:  Moderate (conscious sedation) Pre-sedation assessment:    Time since last food or drink:  3   NPO status caution: urgency dictates proceeding with non-ideal NPO status     ASA classification: class 1 - normal, healthy patient     Neck mobility: normal     Mouth opening:  3 or more finger widths   Thyromental distance:  4 finger widths   Mallampati score:  I - soft palate, uvula, fauces, pillars visible   Pre-sedation assessments completed and reviewed: nausea/vomiting     Pre-sedation assessments completed and reviewed: airway patency not reviewed, cardiovascular function not reviewed, mental status not reviewed, pain level not reviewed and respiratory function not reviewed   Immediate pre-procedure details:    Reassessment: Patient reassessed immediately prior to procedure     Reviewed: vital signs and relevant labs/tests     Verified: bag valve mask available, emergency equipment available, intubation equipment  available, IV patency confirmed, oxygen available and suction available   Procedure details (see MAR for exact dosages):    Preoxygenation:  Nasal cannula   Sedation:  Propofol   Analgesia:  Fentanyl   Intra-procedure monitoring:  Blood pressure monitoring, cardiac monitor, continuous capnometry, continuous pulse oximetry, frequent LOC assessments and frequent vital sign checks   Intra-procedure events: none     Total Provider sedation time (minutes):  30 Post-procedure details:    Attendance: Constant attendance by certified staff until patient recovered     Recovery: Patient returned to pre-procedure baseline     Patient is stable for discharge or admission: yes     Patient tolerance:  Tolerated well, no immediate complications      Tegeler, Gwenyth Allegra, MD 12/27/17 [phone]

## 2017-12-27 NOTE — ED Notes (Signed)
Report attempted bed assign for 16 min.

## 2017-12-28 DIAGNOSIS — S73005A Unspecified dislocation of left hip, initial encounter: Secondary | ICD-10-CM

## 2017-12-28 DIAGNOSIS — S81012A Laceration without foreign body, left knee, initial encounter: Secondary | ICD-10-CM

## 2017-12-28 LAB — CBC
HCT: 29 % — ABNORMAL LOW (ref 36.0–46.0)
Hemoglobin: 9.9 g/dL — ABNORMAL LOW (ref 12.0–15.0)
MCH: 32.5 pg (ref 26.0–34.0)
MCHC: 34.1 g/dL (ref 30.0–36.0)
MCV: 95.1 fL (ref 78.0–100.0)
PLATELETS: 162 10*3/uL (ref 150–400)
RBC: 3.05 MIL/uL — ABNORMAL LOW (ref 3.87–5.11)
RDW: 12.3 % (ref 11.5–15.5)
WBC: 10.2 10*3/uL (ref 4.0–10.5)

## 2017-12-28 MED ORDER — METHOCARBAMOL 750 MG PO TABS: 750.0000 mg | ORAL_TABLET | Freq: Four times a day (QID) | ORAL | 0 refills | Status: DC | PRN

## 2017-12-28 MED ORDER — OXYCODONE HCL 5 MG PO TABS: 5.0000 mg | ORAL_TABLET | ORAL | 0 refills | Status: DC | PRN

## 2017-12-28 MED ORDER — ENOXAPARIN SODIUM 40 MG/0.4ML ~~LOC~~ SOLN: 40.0000 mg | SUBCUTANEOUS | 0 refills | Status: DC

## 2017-12-28 MED ORDER — OXYCODONE HCL 5 MG PO TABS
10.0000 mg | ORAL_TABLET | ORAL | Status: DC | PRN
  Administered 2017-12-28 – 2018-01-01 (×15): 10 mg via ORAL
  Filled 2017-12-28 (×15): qty 2

## 2017-12-28 MED ORDER — KETOROLAC TROMETHAMINE 15 MG/ML IJ SOLN
15.0000 mg | Freq: Four times a day (QID) | INTRAMUSCULAR | Status: DC
  Administered 2017-12-28 – 2018-01-01 (×16): 15 mg via INTRAVENOUS
  Filled 2017-12-28 (×16): qty 1

## 2017-12-28 MED ORDER — TRAZODONE HCL 50 MG PO TABS: 50.0000 mg | ORAL_TABLET | Freq: Every day | ORAL | 0 refills | Status: DC

## 2017-12-28 MED ORDER — OXYCODONE HCL 5 MG PO TABS: 5.0000 mg | ORAL_TABLET | ORAL | Status: DC | PRN

## 2017-12-28 MED ORDER — ONDANSETRON HCL 4 MG PO TABS: 4.0000 mg | ORAL_TABLET | Freq: Four times a day (QID) | ORAL | 0 refills | Status: DC | PRN

## 2017-12-28 NOTE — Anesthesia Postprocedure Evaluation (Signed)
Anesthesia Post Note  Patient: Lauren Matthews  Procedure(s) Performed: OPEN REDUCTION INTERNAL FIXATION (ORIF) FEMORAL HEAD FRACTURE (Left Hip) IRRIGATION AND DEBRIDEMENT KNEE (Left Knee)     Patient location during evaluation: PACU Anesthesia Type: General Level of consciousness: sedated and patient cooperative Pain management: pain level controlled Vital Signs Assessment: post-procedure vital signs reviewed and stable Respiratory status: spontaneous breathing Cardiovascular status: stable Anesthetic complications: no    Last Vitals:  Vitals:   12/27/17 2358 12/28/17 0638  BP: (!) 142/74 (!) 142/82  Pulse: 98 73  Resp: 14 15  Temp: 36.7 C 36.7 C  SpO2: 98% 99%    Last Pain:  Vitals:   12/28/17 0654  TempSrc:   PainSc: [place]

## 2017-12-28 NOTE — Plan of Care (Signed)
  Progressing Education: Knowledge of General Education information will improve 12/28/2017 1510 - Progressing by Rance Muir, RN Health Behavior/Discharge Planning: Ability to manage health-related needs will improve 12/28/2017 1510 - Progressing by Rance Muir, RN Clinical Measurements: Ability to maintain clinical measurements within normal limits will improve 12/28/2017 1510 - Progressing by Rance Muir, RN Will remain free from infection 12/28/2017 1510 - Progressing by Rance Muir, RN Diagnostic test results will improve 12/28/2017 1510 - Progressing by Rance Muir, RN Respiratory complications will improve 12/28/2017 1510 - Progressing by Rance Muir, RN Cardiovascular complication will be avoided 12/28/2017 1510 - Progressing by Rance Muir, RN Activity: Risk for activity intolerance will decrease 12/28/2017 1510 - Progressing by Rance Muir, RN Nutrition: Adequate nutrition will be maintained 12/28/2017 1510 - Progressing by Rance Muir, RN Coping: Level of anxiety will decrease 12/28/2017 1510 - Progressing by Rance Muir, RN Elimination: Will not experience complications related to bowel motility 12/28/2017 1510 - Progressing by Rance Muir, RN Will not experience complications related to urinary retention 12/28/2017 1510 - Progressing by Rance Muir, RN Pain Managment: General experience of comfort will improve 12/28/2017 1510 - Progressing by Rance Muir, RN Safety: Ability to remain free from injury will improve 12/28/2017 1510 - Progressing by Rance Muir, RN Skin Integrity: Risk for impaired skin integrity will decrease 12/28/2017 1510 - Progressing by Rance Muir, RN

## 2017-12-28 NOTE — Discharge Instructions (Addendum)
[facility] Discharge Instructions   General Discharge Instructions  WEIGHT BEARING STATUS: Touchdown weight bearing to left leg  RANGE OF MOTION/ACTIVITY: Posterior hip precautions to left leg  Wound Care: See instructions below  DVT/PE prophylaxis: Lovenox 40mg  daily  Diet: as you were eating previously.  Can use over the counter stool softeners and bowel preparations, such as Miralax, to help with bowel movements.  Narcotics can be constipating.  Be sure to drink plenty of fluids  PAIN MEDICATION USE AND EXPECTATIONS  You have likely been given narcotic medications to help control your pain.  After a traumatic event that results in an fracture (broken bone) with or without surgery, it is ok to use narcotic pain medications to help control one's pain.  We understand that everyone responds to pain differently and each individual patient will be evaluated on a regular basis for the continued need for narcotic medications. Ideally, narcotic medication use should last no more than 6-8 weeks (coinciding with fracture healing).   As a patient it is your responsibility as well to monitor narcotic medication use and report the amount and frequency you use these medications when you come to your office visit.   We would also advise that if you are using narcotic medications, you should take a dose prior to therapy to maximize you participation.  IF YOU ARE ON NARCOTIC MEDICATIONS IT IS NOT PERMISSIBLE TO OPERATE A MOTOR VEHICLE (MOTORCYCLE/CAR/TRUCK/MOPED) OR HEAVY MACHINERY DO NOT MIX NARCOTICS WITH OTHER CNS (CENTRAL NERVOUS SYSTEM) DEPRESSANTS SUCH AS ALCOHOL   STOP SMOKING OR USING NICOTINE PRODUCTS!!!!  As discussed nicotine severely impairs your body's ability to heal surgical and traumatic wounds but also impairs bone healing.  Wounds and bone heal by forming microscopic blood vessels (angiogenesis) and nicotine is a vasoconstrictor (essentially, shrinks blood vessels).   Therefore, if vasoconstriction occurs to these microscopic blood vessels they essentially disappear and are unable to deliver necessary nutrients to the healing tissue.  This is one modifiable factor that you can do to dramatically increase your chances of healing your injury.    (This means no smoking, no nicotine gum, patches, etc)  DO NOT USE NONSTEROIDAL ANTI-INFLAMMATORY DRUGS (NSAID'S)  Using products such as Advil (ibuprofen), Aleve (naproxen), Motrin (ibuprofen) for additional pain control during fracture healing can delay and/or prevent the healing response.  If you would like to take over the counter (OTC) medication, Tylenol (acetaminophen) is ok.  However, some narcotic medications that are given for pain control contain acetaminophen as well. Therefore, you should not exceed more than 4000 mg of tylenol in a day if you do not have liver disease.  Also note that there are may OTC medicines, such as cold medicines and allergy medicines that my contain tylenol as well.  If you have any questions about medications and/or interactions please ask your doctor/PA or your pharmacist.      ICE AND ELEVATE INJURED/OPERATIVE EXTREMITY  Using ice and elevating the injured extremity above your heart can help with swelling and pain control.  Icing in a pulsatile fashion, such as 20 minutes on and 20 minutes off, can be followed.    Do not place ice directly on skin. Make sure there is a barrier between to skin and the ice pack.    Using frozen items such as frozen peas works well as the conform nicely to the are that needs to be iced.  USE AN ACE WRAP OR TED HOSE FOR SWELLING CONTROL  In addition to icing  and elevation, Ace wraps or TED hose are used to help limit and resolve swelling.  It is recommended to use Ace wraps or TED hose until you are informed to stop.    When using Ace Wraps start the wrapping distally (farthest away from the body) and wrap proximally (closer to the body)   Example: If you  had surgery on your leg or thing and you do not have a splint on, start the ace wrap at the toes and work your way up to the thigh        If you had surgery on your upper extremity and do not have a splint on, start the ace wrap at your fingers and work your way up to the upper arm   [place]: [phone]    Discharge Wound Care Instructions  Do NOT apply any ointments, solutions or lotions to pin sites or surgical wounds.  These prevent needed drainage and even though solutions like hydrogen peroxide kill bacteria, they also damage cells lining the pin sites that help fight infection.  Applying lotions or ointments can keep the wounds moist and can cause them to breakdown and open up as well. This can increase the risk for infection. When in doubt call the office.  Surgical incisions should be dressed daily.  If any drainage is noted, use one layer of adaptic, then gauze, Kerlix, and an ace wrap.  Once the incision is completely dry and without drainage, it may be left open to air out.  Showering may begin 36-48 hours later.  Cleaning gently with soap and water.  Traumatic wounds should be dressed daily as well.    One layer of adaptic, gauze, Kerlix, then ace wrap.  The adaptic can be discontinued once the draining has ceased    If you have a wet to dry dressing: wet the gauze with saline the squeeze as much saline out so the gauze is moist (not soaking wet), place moistened gauze over wound, then place a dry gauze over the moist one, followed by Kerlix wrap, then ace wrap.
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
## 2017-12-29 NOTE — Progress Notes (Signed)
Rehab Admissions Coordinator Note:  Patient was screened by Retta Diones for appropriateness for an Inpatient Acute Rehab Consult.  At this time, we are recommending Inpatient Rehab consult.  Retta Diones 12/29/2017, 3:46 PM  I can be reached at [phone].

## 2017-12-29 NOTE — Progress Notes (Signed)
OT Cancellation Note  Patient Details Name: Lauren Matthews MRN: [id number] DOB: 04/01/1976   Cancelled Treatment:    Reason Eval/Treat Not Completed: (Pt with increased pain and had just vomited. Will try back.)  [name] 12/29/2017, 11:26 AM  12/29/2017 Nestor Lewandowsky, OTR/L Pager: [phone]
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
## 2017-12-30 ENCOUNTER — Inpatient Hospital Stay (HOSPITAL_COMMUNITY): Payer: Medicaid Other
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
## 2017-12-31 ENCOUNTER — Encounter (HOSPITAL_COMMUNITY): Payer: Self-pay | Admitting: General Practice

## 2017-12-31 ENCOUNTER — Inpatient Hospital Stay (HOSPITAL_COMMUNITY): Payer: Medicaid Other

## 2017-12-31 DIAGNOSIS — T148XXA Other injury of unspecified body region, initial encounter: Secondary | ICD-10-CM

## 2017-12-31 DIAGNOSIS — Z720 Tobacco use: Secondary | ICD-10-CM

## 2017-12-31 DIAGNOSIS — I10 Essential (primary) hypertension: Secondary | ICD-10-CM

## 2017-12-31 DIAGNOSIS — E876 Hypokalemia: Secondary | ICD-10-CM

## 2017-12-31 DIAGNOSIS — R339 Retention of urine, unspecified: Secondary | ICD-10-CM

## 2017-12-31 DIAGNOSIS — G8918 Other acute postprocedural pain: Secondary | ICD-10-CM

## 2017-12-31 DIAGNOSIS — S81012D Laceration without foreign body, left knee, subsequent encounter: Secondary | ICD-10-CM

## 2017-12-31 DIAGNOSIS — I169 Hypertensive crisis, unspecified: Secondary | ICD-10-CM

## 2017-12-31 DIAGNOSIS — S72052D Unspecified fracture of head of left femur, subsequent encounter for closed fracture with routine healing: Secondary | ICD-10-CM

## 2017-12-31 DIAGNOSIS — M7042 Prepatellar bursitis, left knee: Secondary | ICD-10-CM

## 2017-12-31 DIAGNOSIS — D62 Acute posthemorrhagic anemia: Secondary | ICD-10-CM

## 2017-12-31 LAB — URINALYSIS, ROUTINE W REFLEX MICROSCOPIC
Bilirubin Urine: NEGATIVE
GLUCOSE, UA: NEGATIVE mg/dL
HGB URINE DIPSTICK: NEGATIVE
Ketones, ur: 5 mg/dL — AB
LEUKOCYTES UA: NEGATIVE
Nitrite: NEGATIVE
PROTEIN: NEGATIVE mg/dL
SPECIFIC GRAVITY, URINE: 1.009 (ref 1.005–1.030)
pH: 7 (ref 5.0–8.0)
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
## 2017-12-31 NOTE — Progress Notes (Signed)
Pt with distended abdomen and increased frequency to urinate. Pt has been using bedpan however with only moderate amount of urine. Bladder scanned >999cc. In and out cath done per protocol and had output of 3L. Pt verbalized relief. Nursing will continue to monitor.

## 2017-12-31 NOTE — Progress Notes (Signed)
Orthopedic Tech Progress Note Patient Details:  Lauren Matthews 1976-11-20 [id number]  Ortho Devices Type of Ortho Device: CAM walker Ortho Device/Splint Location: rle Ortho Device/Splint Interventions: Application   Post Interventions Patient Tolerated: Well Instructions Provided: Care of device   Hildred Priest 12/31/2017, 8:22 AM
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
## 2017-12-31 NOTE — Progress Notes (Signed)
I met with pt and her Mom at bedside to discuss a possible inpt rehab admit pending tolerance to do the intensity as well as goals and expectations of the admission. I will follow up tomorrow on her progress to continue those discussions. Inpt rehab admit vs Home with [facility] pending her progress over the next 24 to 48 hrs. [phone]
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
## 2017-12-31 NOTE — Progress Notes (Signed)
Pt agreeable to aromatherapy. No contraindications. Peace blend applied to cottonball and placed at bedside.

## 2018-01-01 ENCOUNTER — Other Ambulatory Visit: Payer: Self-pay

## 2018-01-01 ENCOUNTER — Encounter (HOSPITAL_COMMUNITY): Payer: Self-pay | Admitting: Student

## 2018-01-01 ENCOUNTER — Inpatient Hospital Stay (HOSPITAL_COMMUNITY)
Admission: RE | Admit: 2018-01-01 | Discharge: 2018-01-10 | DRG: 560 | Disposition: A | Discharge status: Home / Self Care | Payer: Medicaid Other | Source: Intra-hospital | Attending: Physical Medicine & Rehabilitation | Admitting: Physical Medicine & Rehabilitation

## 2018-01-01 DIAGNOSIS — S93401D Sprain of unspecified ligament of right ankle, subsequent encounter: Secondary | ICD-10-CM

## 2018-01-01 DIAGNOSIS — S93401A Sprain of unspecified ligament of right ankle, initial encounter: Secondary | ICD-10-CM

## 2018-01-01 DIAGNOSIS — Z87891 Personal history of nicotine dependence: Secondary | ICD-10-CM

## 2018-01-01 DIAGNOSIS — I10 Essential (primary) hypertension: Secondary | ICD-10-CM | POA: Diagnosis present

## 2018-01-01 DIAGNOSIS — Z79899 Other long term (current) drug therapy: Secondary | ICD-10-CM

## 2018-01-01 DIAGNOSIS — E8809 Other disorders of plasma-protein metabolism, not elsewhere classified: Secondary | ICD-10-CM | POA: Diagnosis present

## 2018-01-01 DIAGNOSIS — S72051S Unspecified fracture of head of right femur, sequela: Secondary | ICD-10-CM

## 2018-01-01 DIAGNOSIS — K5903 Drug induced constipation: Secondary | ICD-10-CM | POA: Diagnosis not present

## 2018-01-01 DIAGNOSIS — S72059A Unspecified fracture of head of unspecified femur, initial encounter for closed fracture: Secondary | ICD-10-CM | POA: Diagnosis present

## 2018-01-01 DIAGNOSIS — Z86718 Personal history of other venous thrombosis and embolism: Secondary | ICD-10-CM | POA: Diagnosis not present

## 2018-01-01 DIAGNOSIS — T1490XA Injury, unspecified, initial encounter: Secondary | ICD-10-CM

## 2018-01-01 DIAGNOSIS — D62 Acute posthemorrhagic anemia: Secondary | ICD-10-CM | POA: Diagnosis present

## 2018-01-01 DIAGNOSIS — G8918 Other acute postprocedural pain: Secondary | ICD-10-CM | POA: Diagnosis not present

## 2018-01-01 DIAGNOSIS — K649 Unspecified hemorrhoids: Secondary | ICD-10-CM | POA: Diagnosis present

## 2018-01-01 DIAGNOSIS — K5900 Constipation, unspecified: Secondary | ICD-10-CM | POA: Diagnosis present

## 2018-01-01 DIAGNOSIS — M7989 Other specified soft tissue disorders: Secondary | ICD-10-CM | POA: Diagnosis not present

## 2018-01-01 DIAGNOSIS — S72052D Unspecified fracture of head of left femur, subsequent encounter for closed fracture with routine healing: Principal | ICD-10-CM

## 2018-01-01 DIAGNOSIS — S72052S Unspecified fracture of head of left femur, sequela: Secondary | ICD-10-CM | POA: Diagnosis not present

## 2018-01-01 DIAGNOSIS — I82452 Acute embolism and thrombosis of left peroneal vein: Secondary | ICD-10-CM

## 2018-01-01 DIAGNOSIS — E46 Unspecified protein-calorie malnutrition: Secondary | ICD-10-CM

## 2018-01-01 DIAGNOSIS — S73006D Unspecified dislocation of unspecified hip, subsequent encounter: Secondary | ICD-10-CM | POA: Diagnosis not present

## 2018-01-01 DIAGNOSIS — T148XXA Other injury of unspecified body region, initial encounter: Secondary | ICD-10-CM

## 2018-01-01 LAB — CREATININE, SERUM
CREATININE: 0.76 mg/dL (ref 0.44–1.00)
GFR calc Af Amer: >60 mL/min (ref >60)

## 2018-01-01 LAB — CBC
HCT: 28 % — ABNORMAL LOW (ref 36.0–46.0)
HEMOGLOBIN: 9.5 g/dL — AB (ref 12.0–15.0)
MCH: 32.4 pg (ref 26.0–34.0)
MCHC: 33.9 g/dL (ref 30.0–36.0)
MCV: 95.6 fL (ref 78.0–100.0)
Platelets: 217 10*3/uL (ref 150–400)
RBC: 2.93 MIL/uL — ABNORMAL LOW (ref 3.87–5.11)
RDW: 12.7 % (ref 11.5–15.5)
WBC: 8.2 10*3/uL (ref 4.0–10.5)

## 2018-01-01 MED ORDER — SORBITOL 70 % SOLN
30.0000 mL | Freq: Every day | Status: DC | PRN
  Administered 2018-01-01: 30 mL via ORAL
  Filled 2018-01-01 (×2): qty 30

## 2018-01-01 MED ORDER — ONDANSETRON HCL 4 MG PO TABS: 4.0000 mg | ORAL_TABLET | Freq: Four times a day (QID) | ORAL | Status: DC | PRN

## 2018-01-01 MED ORDER — ENOXAPARIN SODIUM 40 MG/0.4ML ~~LOC~~ SOLN
40.0000 mg | Freq: Every day | SUBCUTANEOUS | Status: DC
  Administered 2018-01-02: 40 mg via SUBCUTANEOUS
  Filled 2018-01-01: qty 0.4

## 2018-01-01 MED ORDER — TRAZODONE HCL 50 MG PO TABS
50.0000 mg | ORAL_TABLET | Freq: Every evening | ORAL | Status: DC | PRN
  Administered 2018-01-02 – 2018-01-09 (×4): 50 mg via ORAL
  Filled 2018-01-01 (×4): qty 1

## 2018-01-01 MED ORDER — SENNA 8.6 MG PO TABS
1.0000 | ORAL_TABLET | Freq: Two times a day (BID) | ORAL | Status: DC
  Administered 2018-01-01 – 2018-01-03 (×4): 8.6 mg via ORAL
  Filled 2018-01-01 (×4): qty 1

## 2018-01-01 MED ORDER — ENOXAPARIN SODIUM 40 MG/0.4ML ~~LOC~~ SOLN: 40.0000 mg | SUBCUTANEOUS | Status: DC

## 2018-01-01 MED ORDER — HYDROCHLOROTHIAZIDE 12.5 MG PO CAPS
12.5000 mg | ORAL_CAPSULE | Freq: Every day | ORAL | Status: DC
  Administered 2018-01-01 – 2018-01-10 (×10): 12.5 mg via ORAL
  Filled 2018-01-01 (×10): qty 1

## 2018-01-01 MED ORDER — OXYCODONE HCL 5 MG PO TABS
5.0000 mg | ORAL_TABLET | ORAL | Status: DC | PRN
  Administered 2018-01-05 – 2018-01-08 (×4): 5 mg via ORAL
  Filled 2018-01-01 (×4): qty 1

## 2018-01-01 MED ORDER — POLYETHYLENE GLYCOL 3350 17 G PO PACK
17.0000 g | PACK | Freq: Every day | ORAL | Status: DC
  Administered 2018-01-01 – 2018-01-10 (×9): 17 g via ORAL
  Filled 2018-01-01 (×9): qty 1

## 2018-01-01 MED ORDER — CITALOPRAM HYDROBROMIDE 10 MG PO TABS
10.0000 mg | ORAL_TABLET | Freq: Every day | ORAL | Status: DC
  Administered 2018-01-02 – 2018-01-10 (×9): 10 mg via ORAL
  Filled 2018-01-01 (×9): qty 1

## 2018-01-01 MED ORDER — ONDANSETRON HCL 4 MG/2ML IJ SOLN: 4.0000 mg | Freq: Four times a day (QID) | INTRAMUSCULAR | Status: DC | PRN

## 2018-01-01 MED ORDER — METHOCARBAMOL 500 MG PO TABS
500.0000 mg | ORAL_TABLET | Freq: Four times a day (QID) | ORAL | Status: DC | PRN
  Administered 2018-01-01 – 2018-01-10 (×22): 500 mg via ORAL
  Filled 2018-01-01 (×23): qty 1

## 2018-01-01 MED ORDER — METHOCARBAMOL 1000 MG/10ML IJ SOLN
500.0000 mg | Freq: Four times a day (QID) | INTRAVENOUS | Status: DC | PRN
  Filled 2018-01-01: qty 5

## 2018-01-01 MED ORDER — BISACODYL 10 MG RE SUPP
10.0000 mg | Freq: Every day | RECTAL | Status: DC | PRN
  Administered 2018-01-01: 10 mg via RECTAL
  Filled 2018-01-01: qty 1

## 2018-01-01 MED ORDER — OXYCODONE HCL 5 MG PO TABS
10.0000 mg | ORAL_TABLET | ORAL | Status: DC | PRN
  Administered 2018-01-01 – 2018-01-10 (×41): 10 mg via ORAL
  Filled 2018-01-01 (×44): qty 2

## 2018-01-01 NOTE — Progress Notes (Signed)
Admit to unit via bed,oriented to unit, scheduled medications and rehab routine.reviewed  Reviewed team conference and plan of care. States an understanding of information. Margarito Liner
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
## 2018-01-01 NOTE — Progress Notes (Signed)
I met with pt and her friend at bedside. She is in agreement to admit to inpt rehab today. I contacted Dr. Doreatha Martin and he is aware and will make the arrangements to d/c. Linna Hoff, Rehab PA is aware of her constipation issues and will place orders for when she is admitted to CIR today. RN CM made aware and I will make the arrangements to admit today. [phone]
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
## 2018-01-02 ENCOUNTER — Encounter (HOSPITAL_COMMUNITY): Payer: Self-pay | Admitting: Student

## 2018-01-02 ENCOUNTER — Inpatient Hospital Stay (HOSPITAL_COMMUNITY): Payer: Self-pay | Admitting: Occupational Therapy

## 2018-01-02 ENCOUNTER — Inpatient Hospital Stay (HOSPITAL_COMMUNITY): Payer: Self-pay

## 2018-01-02 ENCOUNTER — Inpatient Hospital Stay (HOSPITAL_COMMUNITY): Payer: Medicaid Other

## 2018-01-02 ENCOUNTER — Other Ambulatory Visit: Payer: Self-pay

## 2018-01-02 DIAGNOSIS — M7989 Other specified soft tissue disorders: Secondary | ICD-10-CM

## 2018-01-02 DIAGNOSIS — E8809 Other disorders of plasma-protein metabolism, not elsewhere classified: Secondary | ICD-10-CM

## 2018-01-02 DIAGNOSIS — E46 Unspecified protein-calorie malnutrition: Secondary | ICD-10-CM

## 2018-01-02 DIAGNOSIS — G8918 Other acute postprocedural pain: Secondary | ICD-10-CM

## 2018-01-02 DIAGNOSIS — K5903 Drug induced constipation: Secondary | ICD-10-CM

## 2018-01-02 DIAGNOSIS — T1490XA Injury, unspecified, initial encounter: Secondary | ICD-10-CM

## 2018-01-02 LAB — COMPREHENSIVE METABOLIC PANEL
ALT: 25 U/L (ref 14–54)
ANION GAP: 11 (ref 5–15)
AST: 40 U/L (ref 15–41)
Albumin: 2.8 g/dL — ABNORMAL LOW (ref 3.5–5.0)
Alkaline Phosphatase: 63 U/L (ref 38–126)
BUN: 8 mg/dL (ref 6–20)
CHLORIDE: 99 mmol/L — AB (ref 101–111)
CO2: 25 mmol/L (ref 22–32)
CREATININE: 0.68 mg/dL (ref 0.44–1.00)
Calcium: 8.5 mg/dL — ABNORMAL LOW (ref 8.9–10.3)
Glucose, Bld: 112 mg/dL — ABNORMAL HIGH (ref 65–99)
Potassium: 3.5 mmol/L (ref 3.5–5.1)
Sodium: 135 mmol/L (ref 135–145)
Total Bilirubin: 0.9 mg/dL (ref 0.3–1.2)
Total Protein: 5.5 g/dL — ABNORMAL LOW (ref 6.5–8.1)

## 2018-01-02 LAB — CBC WITH DIFFERENTIAL/PLATELET
Basophils Absolute: 0 10*3/uL (ref 0.0–0.1)
Basophils Relative: 0 %
EOS PCT: 1 %
Eosinophils Absolute: 0.1 10*3/uL (ref 0.0–0.7)
HCT: 28.9 % — ABNORMAL LOW (ref 36.0–46.0)
Hemoglobin: 9.7 g/dL — ABNORMAL LOW (ref 12.0–15.0)
LYMPHS ABS: 1.3 10*3/uL (ref 0.7–4.0)
LYMPHS PCT: 15 %
MCH: 32 pg (ref 26.0–34.0)
MCHC: 33.6 g/dL (ref 30.0–36.0)
MCV: 95.4 fL (ref 78.0–100.0)
MONO ABS: 0.9 10*3/uL (ref 0.1–1.0)
MONOS PCT: 11 %
Neutro Abs: 6.1 10*3/uL (ref 1.7–7.7)
Neutrophils Relative %: 73 %
PLATELETS: 226 10*3/uL (ref 150–400)
RBC: 3.03 MIL/uL — AB (ref 3.87–5.11)
RDW: 12.7 % (ref 11.5–15.5)
WBC: 8.3 10*3/uL (ref 4.0–10.5)

## 2018-01-02 MED ORDER — ENOXAPARIN SODIUM 80 MG/0.8ML ~~LOC~~ SOLN
1.0000 mg/kg | Freq: Two times a day (BID) | SUBCUTANEOUS | Status: DC
  Administered 2018-01-02 – 2018-01-03 (×2): 65 mg via SUBCUTANEOUS
  Filled 2018-01-02 (×2): qty 0.65

## 2018-01-02 MED ORDER — PRO-STAT SUGAR FREE PO LIQD
30.0000 mL | Freq: Two times a day (BID) | ORAL | Status: DC
  Administered 2018-01-02 – 2018-01-10 (×17): 30 mL via ORAL
  Filled 2018-01-02 (×16): qty 30

## 2018-01-02 MED ORDER — HYDROCORTISONE 2.5 % RE CREA
TOPICAL_CREAM | Freq: Every day | RECTAL | Status: DC | PRN
  Administered 2018-01-02 – 2018-01-09 (×2): via RECTAL
  Filled 2018-01-02 (×2): qty 28.35
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
## 2018-01-02 NOTE — Progress Notes (Signed)
Social Work Patient ID: Lauren Matthews, female   DOB: 10-Apr-1976, 42 y.o.   MRN: [id number]  Met with pt to discuss team conference goals mod/i-supervision level and target discharge 2/7. Main issue will be getting her inside parent's home with three steps. PT to work with on this and problem solve what will be the best way. She feels good about this plan and had a good first day.
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
## 2018-01-02 NOTE — Progress Notes (Signed)
Occupational Therapy Note  Patient Details  Name: Lauren Matthews MRN: [id number] Date of Birth: Nov 30, 1976  Pt missed 30 mins of OT secondary to being on bedrest for LLE DVT.  Will resume therapy tomorrow MD permitting.     Roxane Puerto OTR/L 01/02/2018, 3:28 PM

## 2018-01-02 NOTE — Progress Notes (Signed)
*  Preliminary Results* Bilateral lower extremity venous duplex completed. Right lower extremity is negative for deep vein thrombosis.  Left lower extremity is positive for acute deep vein thrombosis involving the left peroneal veins.  There is no evidence of Baker's cyst bilaterally.  Preliminary results discussed with Threasa Beards, RN.  01/02/2018 2:55 PM Maudry Mayhew, BS, RVT, RDCS, RDMS

## 2018-01-02 NOTE — Care Management Note (Signed)
[facility] Individual Statement of Services  Patient Name:  Lauren Matthews  Date:  01/02/2018  Welcome to the [facility].  Our goal is to provide you with an individualized program based on your diagnosis and situation, designed to meet your specific needs.  With this comprehensive rehabilitation program, you will be expected to participate in at least 3 hours of rehabilitation therapies Monday-Friday, with modified therapy programming on the weekends.  Your rehabilitation program will include the following services:  Physical Therapy (PT), Occupational Therapy (OT), 24 hour per day rehabilitation nursing, Therapeutic Recreaction (TR), Neuropsychology, Case Management (Social Worker), Rehabilitation Medicine, Nutrition Services and Pharmacy Services  Weekly team conferences will be held on Wednesday to discuss your progress.  Your Social Worker will talk with you frequently to get your input and to update you on team discussions.  Team conferences with you and your family in attendance may also be held.  Expected length of stay: 7-9 days  Overall anticipated outcome: mod/i-supervision level  Depending on your progress and recovery, your program may change. Your Social Worker will coordinate services and will keep you informed of any changes. Your Social Worker's name and contact numbers are listed  below.  The following services may also be recommended but are not provided by the [facility] will be made to provide these services after discharge if needed.  Arrangements include referral to agencies that provide these services.  Your insurance has been verified to be:  Applying for Medicaid & Med-Pay Your primary doctor is:  Wenda Low  Pertinent information will be shared with your doctor  and your insurance company.  Social Worker:  Ovidio Kin, [place] or ([phone]  Information discussed with and copy given to patient by: Elease Hashimoto, 01/02/2018, 9:07 AM

## 2018-01-03 ENCOUNTER — Inpatient Hospital Stay (HOSPITAL_COMMUNITY): Payer: Self-pay | Admitting: Occupational Therapy

## 2018-01-03 ENCOUNTER — Inpatient Hospital Stay (HOSPITAL_COMMUNITY): Payer: Self-pay | Admitting: Physical Therapy

## 2018-01-03 DIAGNOSIS — I82452 Acute embolism and thrombosis of left peroneal vein: Secondary | ICD-10-CM

## 2018-01-03 DIAGNOSIS — Z86718 Personal history of other venous thrombosis and embolism: Secondary | ICD-10-CM

## 2018-01-03 MED ORDER — RIVAROXABAN 20 MG PO TABS: 20.0000 mg | ORAL_TABLET | Freq: Every day | ORAL | Status: DC

## 2018-01-03 MED ORDER — SENNOSIDES-DOCUSATE SODIUM 8.6-50 MG PO TABS
2.0000 | ORAL_TABLET | Freq: Every day | ORAL | Status: DC
  Administered 2018-01-03 – 2018-01-09 (×6): 2 via ORAL
  Filled 2018-01-03 (×8): qty 2

## 2018-01-03 MED ORDER — RIVAROXABAN 15 MG PO TABS
15.0000 mg | ORAL_TABLET | Freq: Two times a day (BID) | ORAL | Status: DC
  Administered 2018-01-03 – 2018-01-10 (×15): 15 mg via ORAL
  Filled 2018-01-03 (×16): qty 1

## 2018-01-03 MED ORDER — LIDOCAINE 5 % EX PTCH
1.0000 | MEDICATED_PATCH | CUTANEOUS | Status: DC
  Administered 2018-01-03 – 2018-01-10 (×8): 1 via TRANSDERMAL
  Filled 2018-01-03 (×8): qty 1
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
## 2018-01-03 NOTE — Progress Notes (Signed)
Occupational Therapy Session Note  Patient Details  Name: Lauren Matthews MRN: [id number] Date of Birth: 25-Oct-1976  Today's Date: 01/03/2018 OT Individual Time: 3009-2330 OT Individual Time Calculation (min): 64 min    Short Term Goals: Week 1:  OT Short Term Goal 1 (Week 1): STGs equal to LTGs set a supervision to modified independent level.   Skilled Therapeutic Interventions/Progress Updates:    Pt transferred from supine to sit EOB with min assist for moving the LLE off of the EOB maintaining hip precautions.  She was able to ambulate to the toilet with min assist and use of the RW after therapist provided max assist for donning cam boot over the RLE. She completed toileting with min assist sit to stand and then transferred to the tub bench with min assist using the RW.  Slight increased nausea reported by pt during session as well.  She was able to utilize the reacher and the LH sponge for bathing and for drying off with min assist.  She then transferred out to the wheelchair for dressing.  Pt's mother present after shower as well so began education on dressing following hip precautions.  She was able to integrate the reacher for threading pants over feet with supervision but needed max assist for pulling pants over hips.  She needed use of the sockaide with mod assist to thread socks over feet.   Max assist for donning cam boot prior to standing after donning socks.  Pt left in wheelchair with mother present at end of session with call button and phone in reach.    Therapy Documentation Precautions:  Precautions Precautions: Posterior Hip Precaution Booklet Issued: Yes (comment) Precaution Comments: pt able to recall 3/3 precautions Required Braces or Orthoses: Other Brace/Splint Other Brace/Splint: CAM boot on R Restrictions Weight Bearing Restrictions: Yes RLE Weight Bearing: Weight bearing as tolerated LLE Weight Bearing: Touchdown weight bearing  Pain: Pain Assessment Pain  Assessment: Faces Faces Pain Scale: Hurts little more Pain Type: Surgical pain Pain Location: Leg Pain Orientation: Left Pain Onset: With Activity Pain Intervention(s): Emotional support;Repositioned ADL: See Function Navigator for Current Functional Status.   Therapy/Group: Individual Therapy  Jashaun Penrose OTR/L 01/03/2018, 9:42 AM

## 2018-01-03 NOTE — Discharge Instructions (Addendum)
Inpatient Rehab Discharge Instructions  Lauren Matthews Discharge date and time: No discharge date for patient encounter.   Activities/Precautions/ Functional Status: Activity: Left lower extremity touchdown weightbearing with posterior hip precautions Diet: regular diet Wound Care: keep wound clean and dry Functional status:  ___ No restrictions     ___ Walk up steps independently ___ 24/7 supervision/assistance   ___ Walk up steps with assistance ___ Intermittent supervision/assistance  ___ Bathe/dress independently ___ Walk with walker     _x__ Bathe/dress with assistance ___ Walk Independently    ___ Shower independently ___ Walk with assistance    ___ Shower with assistance ___ No alcohol     ___ Return to work/school ________  Special Instructions: No driving smoking or alcohol    COMMUNITY REFERRALS UPON DISCHARGE:    Home Health:   PT & OT  Agency:[facility]   Date of last service:01/10/2018   Medical Equipment/Items Ordered:WHEELCHAIR & [place]   [phone]  Other:MOM GOT TUB BENCH & BSC    My questions have been answered and I understand these instructions. I will adhere to these goals and the provided educational materials after my discharge from the hospital.  Patient/Caregiver Signature _______________________________ Date __________  Clinician Signature _______________________________________ Date __________  Please bring this form and your medication list with you to all your follow-up doctor's appointments. Information on my medicine - XARELTO (rivaroxaban)  This medication education was reviewed with me or my healthcare representative as part of my discharge preparation.  WHY WAS XARELTO PRESCRIBED FOR YOU? Xarelto was prescribed to treat blood clots that may have been found in the veins of your legs (deep vein thrombosis) or in your lungs (pulmonary embolism) and to reduce the risk of  them occurring again.  What do you need to know about Xarelto? The starting dose is one 15 mg tablet taken TWICE daily with food for the FIRST 21 DAYS then on 2/21 the dose is changed to one 20 mg tablet taken ONCE A DAY with your evening meal.  DO NOT stop taking Xarelto without talking to the health care provider who prescribed the medication.  Refill your prescription for 20 mg tablets before you run out.  After discharge, you should have regular check-up appointments with your healthcare provider that is prescribing your Xarelto.  In the future your dose may need to be changed if your kidney function changes by a significant amount.  What do you do if you miss a dose? If you are taking Xarelto TWICE DAILY and you miss a dose, take it as soon as you remember. You may take two 15 mg tablets (total 30 mg) at the same time then resume your regularly scheduled 15 mg twice daily the next day.  If you are taking Xarelto ONCE DAILY and you miss a dose, take it as soon as you remember on the same day then continue your regularly scheduled once daily regimen the next day. Do not take two doses of Xarelto at the same time.   Important Safety Information Xarelto is a blood thinner medicine that can cause bleeding. You should call your healthcare provider right away if you experience any of the following: ? Bleeding from an injury or your nose that does not stop. ? Unusual colored urine (red or dark brown) or unusual colored stools (red or black). ? Unusual bruising for unknown reasons. ? A serious fall or if you hit your head (even if there is no bleeding).  Some medicines may interact with Xarelto and might increase your risk of bleeding while on Xarelto. To help avoid this, consult your healthcare provider or pharmacist prior to using any new prescription or non-prescription medications, including herbals, vitamins, non-steroidal anti-inflammatory drugs (NSAIDs) and supplements.  This  website has more information on Xarelto: [email or web address]
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
## 2018-01-03 NOTE — Progress Notes (Signed)
Physical Therapy Session Note  Patient Details  Name: Lauren Matthews MRN: [id number] Date of Birth: 1976/08/08  Today's Date: 01/03/2018 PT Individual Time: 1000-1100 AND 1445-1556 PT Individual Time Calculation (min): 60 min AND 71 min  Short Term Goals: Week 1:  PT Short Term Goal 1 (Week 1): STG=LTG due to ELOS  Skilled Therapeutic Interventions/Progress Updates:   Session 1:  Pt in w/c and agreeable to therapy, pain 4/10 at rest. Per conversation w/ RN, pt ok and safe to participate in OOB therapy at low level and at w/c level. Pt self-propelled w/c to therapy gym to work on functional endurance. Transferred to/from mat via strand pivot w/ RW w/ min assist, to/from supine w/ min assist for LE management to perform BLE strengthening exercises within post-op hip precautions in both seated and supine. Mild increase in pain w/ exercises, up to 6-7/10 at the most painful, however it resolves w/ brief rest. Pt c/o squeezing and pressure in L knee, worst in dependent positions - made RN aware, pt understands it is likely from L DVT. Returned to room via w/c and ended session in w/c, call bell within reach and all needs met. Pain returned to 4/10 at rest.   BLE strengthening exercises: -seated LAQs 2x10, L assisted and within tolerable pain range -seated heel slides 2x10, L assisted and within tolerable pain range -supine ankle pumps 2x20  -supine quad sets into towel roll 2x10 -R only SLR 2x10 -R only supine heel slide 2x10 -R abduction slides 2x10, L isometric abduction squeeze 2x10  Session 2:  Pt supine and agreeable to therapy, c/o 4/10 pain again this afternoon. Requesting to toilet. Transferred to EOB w/ min assist for LLE management. Ambulated to toilet in bathroom w/ min guard, 15' using RW. Min assist for LE garment management, supervision for pericare. Transferred toilet to w/c via stand pivot w/ RW. Pt self-propelled w/c to/from therapy gym w/ supervision to work on functional  endurance. Per verbal conversation w/ PA-C, pt ok to ambulate. Practiced gait w/ RW, 20' x2 bouts w/ verbal cues for precautions and close supervision to min guard. Pt w/ most pain and discomfort when transitioning from sit<>stand. Practiced sit<>stands x5 from w/c, verbal cues for technique and safety precautions, min guard for safety. Worked on tolerance to static standing while performing isometric L hip flexion and abduction exercises in stance w/ UE support on RW. Seated rest breaks throughout session 2/2 fatigue and increased pain w/ LLE in dependent position. Pain resolves w/ rest. Returned to room in w/c, ended session in w/c, call bell within reach and all needs met.   Therapy Documentation Precautions:  Precautions Precautions: Posterior Hip Precaution Booklet Issued: Yes (comment) Precaution Comments: pt able to recall 3/3 precautions Required Braces or Orthoses: Other Brace/Splint Other Brace/Splint: CAM boot on R Restrictions Weight Bearing Restrictions: Yes RLE Weight Bearing: Weight bearing as tolerated LLE Weight Bearing: Touchdown weight bearing Pain: Pain Assessment Pain Assessment: Faces Faces Pain Scale: Hurts little more Pain Type: Surgical pain Pain Location: Leg Pain Orientation: Left Pain Onset: With Activity Pain Intervention(s): Emotional support;Repositioned  See Function Navigator for Current Functional Status.   Therapy/Group: Individual Therapy  Leni Pankonin K Arnette 01/03/2018, 11:42 AM

## 2018-01-04 ENCOUNTER — Inpatient Hospital Stay (HOSPITAL_COMMUNITY): Payer: Self-pay | Admitting: Occupational Therapy

## 2018-01-04 ENCOUNTER — Inpatient Hospital Stay (HOSPITAL_COMMUNITY): Payer: Self-pay | Admitting: Physical Therapy

## 2018-01-04 DIAGNOSIS — S93401S Sprain of unspecified ligament of right ankle, sequela: Secondary | ICD-10-CM

## 2018-01-04 DIAGNOSIS — E8809 Other disorders of plasma-protein metabolism, not elsewhere classified: Secondary | ICD-10-CM | POA: Diagnosis present

## 2018-01-04 DIAGNOSIS — S72052S Unspecified fracture of head of left femur, sequela: Secondary | ICD-10-CM

## 2018-01-04 DIAGNOSIS — Z87891 Personal history of nicotine dependence: Secondary | ICD-10-CM | POA: Diagnosis not present

## 2018-01-04 DIAGNOSIS — G8918 Other acute postprocedural pain: Secondary | ICD-10-CM

## 2018-01-04 DIAGNOSIS — D62 Acute posthemorrhagic anemia: Secondary | ICD-10-CM

## 2018-01-04 DIAGNOSIS — S72052D Unspecified fracture of head of left femur, subsequent encounter for closed fracture with routine healing: Secondary | ICD-10-CM | POA: Diagnosis present

## 2018-01-04 DIAGNOSIS — Z79899 Other long term (current) drug therapy: Secondary | ICD-10-CM | POA: Diagnosis not present

## 2018-01-04 DIAGNOSIS — K5900 Constipation, unspecified: Secondary | ICD-10-CM | POA: Diagnosis present

## 2018-01-04 DIAGNOSIS — I10 Essential (primary) hypertension: Secondary | ICD-10-CM | POA: Diagnosis present

## 2018-01-04 DIAGNOSIS — Z86718 Personal history of other venous thrombosis and embolism: Secondary | ICD-10-CM | POA: Diagnosis not present

## 2018-01-04 DIAGNOSIS — S73006D Unspecified dislocation of unspecified hip, subsequent encounter: Secondary | ICD-10-CM | POA: Diagnosis not present

## 2018-01-04 DIAGNOSIS — K649 Unspecified hemorrhoids: Secondary | ICD-10-CM | POA: Diagnosis present

## 2018-01-04 DIAGNOSIS — T1490XA Injury, unspecified, initial encounter: Secondary | ICD-10-CM

## 2018-01-04 DIAGNOSIS — S93401D Sprain of unspecified ligament of right ankle, subsequent encounter: Secondary | ICD-10-CM | POA: Diagnosis not present

## 2018-01-04 DIAGNOSIS — S93401A Sprain of unspecified ligament of right ankle, initial encounter: Secondary | ICD-10-CM

## 2018-01-04 DIAGNOSIS — I82492 Acute embolism and thrombosis of other specified deep vein of left lower extremity: Secondary | ICD-10-CM

## 2018-01-04 NOTE — Progress Notes (Signed)
Occupational Therapy Session Note  Patient Details  Name: Lauren Matthews MRN: [id number] Date of Birth: Apr 11, 1976  Today's Date: 01/04/2018 OT Individual Time: 1451-1531 OT Individual Time Calculation (min): 40 min    Short Term Goals: Week 1:  OT Short Term Goal 1 (Week 1): STGs equal to LTGs set a supervision to modified independent level.   Skilled Therapeutic Interventions/Progress Updates:    Pt completed transfer from supine to sit EOB with min assist to help advance the LLE to the side of the bed.  She then completed stand pivot transfer from the EOB to the wheelchair with min guard assist.  She was able to roll her wheelchair down to the therapy gym for practice on functional mobility over simulated shower edge with use of the RW.  She was able to complete four attempts at stepping over simulated edge with the RW and min assist, while maintaining her weightbearing status.  Min instructional cueing for technique as well.  Progressed to working on sit to stand transitions from wheelchair and from therapy mat with min guard assist at various heights.  Returned to room at end of session with pt propelling wheelchair with supervision.  Pt was left in wheelchair at bedside with call button and phone in reach.    Therapy Documentation Precautions:  Precautions Precautions: Posterior Hip Precaution Booklet Issued: Yes (comment) Precaution Comments: pt able to recall 3/3 precautions Required Braces or Orthoses: Other Brace/Splint Other Brace/Splint: CAM boot on R Restrictions Weight Bearing Restrictions: Yes RLE Weight Bearing: Weight bearing as tolerated LLE Weight Bearing: Touchdown weight bearing  Pain: Pain Assessment Pain Assessment: 0-10 Pain Score: 6  Pain Type: Surgical pain Pain Location: Leg Pain Orientation: Left Pain Descriptors / Indicators: Discomfort;Sore Pain Onset: With Activity Pain Intervention(s): Repositioned ADL: See Function Navigator for Current  Functional Status.   Therapy/Group: Individual Therapy  Angeliyah Kirkey OTR/L 01/04/2018, 3:49 PM

## 2018-01-04 NOTE — Progress Notes (Signed)
Physical Therapy Session Note  Patient Details  Name: Lauren Matthews MRN: [id number] Date of Birth: 03/22/1976  Today's Date: 01/04/2018 PT Individual Time: 1005-1045 AND 1610-1650 PT Individual Time Calculation (min): 40 min AND 40 min   Short Term Goals: Week 1:  PT Short Term Goal 1 (Week 1): STG=LTG due to ELOS  Skilled Therapeutic Interventions/Progress Updates:   Session 1:  Pt in w/c and agreeable to therapy, c/o 3/10 in L knee at rest and 5/10 when in standing. Pt self-propelled w/c to/from therapy gym w/ supervision using BUEs. Transferred to/from mat via stand pivot using RW w/ min guard for safety. Transferred to/from supine w/ min assist for LLE management. Performed BLE strengthening exercises as listed below, decreased pain during exercises compared to previous date. Pt reports she feels more comfortable moving LEs against gravity. Returned to room in w/c and ended session in w/c, call bell within reach and all needs met.   BLE strengthening exercises: -seated LAQs 2x10, L within pain tolerable range -seated heel slides 2x10, L within pain tolerable range -supine quad sets 2x10 -supine ankle pumps 2x10  -supine SAQs 2x10, L assisted  -supine heel slides R only 2x10 -supine abduction slides R only 2x10 -supine isometric abduction L only 1x10 -supine isometric hip extension L only 1x10   Session 2:  Pt in w/c and agreeable to therapy, c/o 4/10 pain at rest, 5-6/10 w/ gait. Supervision w/c transport to/from gym to work on functional endurance. Practiced gait in therapy gym w/ min guard to close supervision for safety. Two 40' bouts using RW w/ emphasis on R swing through gait pattern and relying on UEs for support vs RLE. Worked on being able to clear step w/ RLE using RW. Performed 2" step w/ min guard 1x5 reps. Performed 3" step w/ min guard 2x2 reps. Emphasis on WB through both UEs on RW in controlled manner. Pt returning to parents house that has 3 steps to enter. Returned  to room and ended session in w/c, in care of family and all needs met.   Therapy Documentation Precautions:  Precautions Precautions: Posterior Hip Precaution Booklet Issued: Yes (comment) Precaution Comments: pt able to recall 3/3 precautions Required Braces or Orthoses: Other Brace/Splint Other Brace/Splint: CAM boot on R Restrictions Weight Bearing Restrictions: Yes RLE Weight Bearing: Weight bearing as tolerated LLE Weight Bearing: Touchdown weight bearing Pain: Pain Assessment Pain Assessment: Faces Pain Score: 4  Faces Pain Scale: Hurts a little bit Pain Type: Acute pain Pain Location: Leg Pain Orientation: Left Pain Descriptors / Indicators: Discomfort  See Function Navigator for Current Functional Status.   Therapy/Group: Individual Therapy  [name] K Arnette 01/04/2018, 11:33 AM

## 2018-01-04 NOTE — Progress Notes (Signed)
Occupational Therapy Session Note  Patient Details  Name: Lauren Matthews MRN: [id number] Date of Birth: 08-Jul-1976  Today's Date: 01/04/2018 OT Individual Time: 0800-0900 OT Individual Time Calculation (min): 60 min    Short Term Goals: Week 1:  OT Short Term Goal 1 (Week 1): STGs equal to LTGs set a supervision to modified independent level.   Skilled Therapeutic Interventions/Progress Updates:    Pt completed bathing and dressing during session.  Min assist for stand pivot transfers from bed to shower seat and from shower seat to wheelchair.  Supervision for all bathing in sitting with lateral leans side to side.  She was able to perform UB dressing with supervision as well and min assist for LB dressing sit to stand with use of the reacher and sockaide.  Therapist assisted with donning right cam boot secondary to time but she was able to remove it prior to shower by bringing the RLE up without breaking her hip precautions.  She was able to complete all grooming with setup assist only.  Pt left in wheelchair with call button in reach and pt's mother present.    Therapy Documentation Precautions:  Precautions Precautions: Posterior Hip Precaution Booklet Issued: Yes (comment) Precaution Comments: pt able to recall 3/3 precautions Required Braces or Orthoses: Other Brace/Splint Other Brace/Splint: CAM boot on R Restrictions Weight Bearing Restrictions: Yes RLE Weight Bearing: Weight bearing as tolerated LLE Weight Bearing: Touchdown weight bearing   Pain: Pain Assessment Pain Assessment: Faces Faces Pain Scale: Hurts a little bit Pain Type: Acute pain Pain Location: Leg Pain Orientation: Left Pain Descriptors / Indicators: Discomfort ADL: See Function Navigator for Current Functional Status.   Therapy/Group: Individual Therapy  Wandra Babin OTR/L 01/04/2018, 9:06 AM

## 2018-01-04 NOTE — Progress Notes (Signed)
Occupational Therapy Session Note  Patient Details  Name: Lauren Matthews MRN: [id number] Date of Birth: 1976/05/05  Today's Date: 01/04/2018 OT Individual Time: 1100-1130 OT Individual Time Calculation (min): 30 min    Short Term Goals: Week 1:  OT Short Term Goal 1 (Week 1): STGs equal to LTGs set a supervision to modified independent level.   Skilled Therapeutic Interventions/Progress Updates:    1:1 Focus on w/c parts management while maintaining hip precautions. Pt able to unlock leg rests but unable to take off and put back on. Pt able to propel w/c without Leg rest on. Educated on shower stall and tub transfers with 3:1 and tub bench. Performed shower stall transfers with simulated ledge with min A backing up into shower stall to a chair. When hopping forward over the ledge- lost balance forward requiring max A to regain balance.   Discussed and showed her tub bench over tub ledge as an option for her home. (shower stall is at her parent' s house).   Also educated on w/c mobility in the kitchen and planning ahead to assist with I[place].  Left with ice on left hip sitting up in w/c.   Therapy Documentation Precautions:  Precautions Precautions: Posterior Hip Precaution Booklet Issued: Yes (comment) Precaution Comments: pt able to recall 3/3 precautions Required Braces or Orthoses: Other Brace/Splint Other Brace/Splint: CAM boot on R Restrictions Weight Bearing Restrictions: Yes RLE Weight Bearing: Weight bearing as tolerated LLE Weight Bearing: Touchdown weight bearing Pain: Pain Assessment Pain Assessment: 0-10 Pain Score: 0-No pain  See Function Navigator for Current Functional Status.   Therapy/Group: Individual Therapy  Willeen Cass [facility] 01/04/2018, 3:14 PM

## 2018-01-04 NOTE — Progress Notes (Signed)
Orthopaedic Trauma Progress Note  S: In good spirits this morning states that her pain is doing better.  Notes that her ankle is hurting worse due to being the only good leg that she can stand on.  O:  Vitals:   01/03/18 1609 01/04/18 0500  BP: 114/76 126/79  Pulse: 86 71  Resp: 18 16  Temp: 98.5 F (36.9 C) 98 F (36.7 C)  SpO2: 99% 100%   LLE: incisions clean, dry and intact.   Compartments are soft and compressible.  Neurovascularly intact distally with intact motor and sensory function to all nerve distributions.  Warm well-perfused foot.  States that she has continued numbness along LFCN  RLE: Boot removed.  The patient has notable swelling over the posterior ankle.  There is notable tenderness and swelling over the peroneal tendons running posteriorly to the lateral malleolus.  There is no tenderness over the ATFL or over the lateral malleolus.  There is mild tenderness over the medial malleolus and posterior structures.  She is neurovascularly intact.  A/P: 42 year old female status post ORIF of Pipkin 2 fracture dislocation and left knee traumatic arthrotomy  -Touchdown weightbearing left lower extremity -Posterior hip precautions -PT/OT -Boot for RLE, appears she may have some traumatic injury to her peroneal tendons that will be treated nonoperatively and should improve with immobilization and time. -Xarelto for her peroneal DVT -Please contact me Wednesday of next week so that I can obtain x-rays and evaluate her wounds to hopefully save a postoperative visit   Shona Needles, MD [facility] [phone] (phone)

## 2018-01-04 NOTE — Progress Notes (Signed)
[facility]ILIT[facility]     PROGRESS NOTE  Subjective/Complaints:  Patient seen sitting up in bed this morning. She states she slept well overnight. She notes right ankle pain without boot.  ROS: Denies CP, SOB, nausea, vomiting, diarrhea.  Objective: Vital Signs: Blood pressure 126/79, pulse 71, temperature 98 F (36.7 C), temperature source Oral, resp. rate 16, height 5\' 6"  (1.676 m), weight 64 kg (141 lb 1.5 oz), last menstrual period 12/14/2017, SpO2 100 %. No results found. Recent Labs    01/01/18 1644 01/02/18 0510  WBC 8.2 8.3  HGB 9.5* 9.7*  HCT 28.0* 28.9*  PLT 217 226   Recent Labs    01/01/18 1644 01/02/18 0510  NA  --  135  K  --  3.5  CL  --  99*  GLUCOSE  --  112*  BUN  --  8  CREATININE 0.76 0.68  CALCIUM  --  8.5*   CBG (last 3)  No results for input(s): GLUCAP in the last 72 hours.  Wt Readings from Last 3 Encounters:  01/01/18 64 kg (141 lb 1.5 oz)  12/27/17 63.5 kg (140 lb)  07/27/17 65.8 kg (145 lb)    Physical Exam:  BP 126/79 (BP Location: Left Arm)   Pulse 71   Temp 98 F (36.7 C) (Oral)   Resp 16   Ht 5\' 6"  (1.676 m)   Wt 64 kg (141 lb 1.5 oz)   LMP 12/14/2017 (Exact Date)   SpO2 100%   BMI 22.77 kg/m  Gen: NAD. Well-developed.  HENT: Normocephalic. Atraumatic.  Eyes:EOMare normal. No discharge.  Cardiovascular:RRR. No JVD. Respiratory:Effort normal and breath sounds normal.  JE:HUDJS sounds are normal. She exhibitsno distension. Musculoskeletal:+TTP left knee, right ankle with edema, stable  Neurological: She isalertand oriented. Right upper extremity: 4/5 proximal to distal.  Left upper extremity: 5/5 proximal to distal.  Right lower extremity: 3/5 hip flexion and knee extension, 4/5 ankle dorsiflexion/plantarflexion (some pain inhibition) Left lower extremity: 2-/5 hip flexion, 3/5 ankle dorsiflexion/plantarflexion (some pain inhibition, unchanged).  Skin. Left lower extremitywound with  dressing are clean and intact. Scattered hematomas  Assessment/Plan: 1. Functional deficits secondary to polytrauma with left hip fracture which require 3+ hours per day of interdisciplinary therapy in a comprehensive inpatient rehab setting. Physiatrist is providing close team supervision and 24 hour management of active medical problems listed below. Physiatrist and rehab team continue to assess barriers to discharge/monitor patient progress toward functional and medical goals.  Function:  Bathing Bathing position   Position: Shower  Bathing parts Body parts bathed by patient: Right arm, Left arm, Chest, Abdomen, Front perineal area, Right upper leg, Left upper leg, Right lower leg, Left lower leg, Back Body parts bathed by helper: Buttocks  Bathing assist        Upper Body Dressing/Undressing Upper body dressing   What is the patient wearing?: Pull over shirt/dress     Pull over shirt/dress - Perfomed by patient: Thread/unthread right sleeve, Thread/unthread left sleeve, Put head through opening, Pull shirt over trunk          Upper body assist Assist Level: Set up      Lower Body Dressing/Undressing Lower body dressing   What is the patient wearing?: Pants, Non-skid slipper socks, AFO     Pants- Performed by patient: Thread/unthread right pants leg, Thread/unthread left pants leg Pants- Performed by helper: Pull pants up/down   Non-skid slipper socks- Performed by helper: Don/doff right sock, Don/doff left sock  AFO - Performed by patient: Don/doff right AFO(cam boot)        Lower body assist        Toileting Toileting   Toileting steps completed by patient: Adjust clothing prior to toileting, Performs perineal hygiene, Adjust clothing after toileting      Toileting assist Assist level: Touching or steadying assistance (Pt.75%)   Transfers Chair/bed transfer   Chair/bed transfer method: Stand pivot, Ambulatory Chair/bed transfer assist level:  Touching or steadying assistance (Pt > 75%) Chair/bed transfer assistive device: Armrests, Medical sales representative     Max distance: 20' Assist level: Touching or steadying assistance (Pt > 75%)   Wheelchair   Type: Manual Max wheelchair distance: 150 ft Assist Level: Supervision or verbal cues  Cognition Comprehension Comprehension assist level: Follows complex conversation/direction with no assist  Expression Expression assist level: Expresses complex ideas: With no assist  Social Interaction Social Interaction assist level: Interacts appropriately with others with medication or extra time (anti-anxiety, antidepressant).  Problem Solving Problem solving assist level: Solves complex problems: Recognizes & self-corrects  Memory Memory assist level: Complete Independence: No helper     Medical Problem List and Plan: 1.Decreased functional mobilitysecondary to left Pipkin II femoral head fracture dislocation and left knee wound. Status post I&Dof left knee, open treatment of hip dislocation, ORIF femoral head fracture 12/27/2017. Touchdown weightbearing with posterior hip precautions   Cont CIR 2. DVT Prophylaxis/Anticoagulation: Monitor for any bleeding episodes.    Vascular study with left peroneal DVT, continue Xarelto 3. Pain Management:Robaxin and oxycodone as needed   Lidoderm patch added to left hip 4. Mood:Celexa 10 mg daily. Provide emotional support 5. Neuropsych: This patientiscapable of making decisions on herown behalf. 6. Skin/Wound Care:Routine skin checks 7. Fluids/Electrolytes/Nutrition:Routine I&O's    BMP within acceptable range on 1/30   Labs ordered for Monday 8.Acute blood loss anemia.    Hemoglobin 9.7 on 1/30   Labs ordered for Monday   Continue to monitor 9.History of hypertension.HCTZ 12.5 mg daily   Controlled on 2/1 10.Tobacco abuse. Counseling 11.Constipation. Laxative assistance   Bowel meds increased on 1/31 12.  Hypoalbuminemia   Supplement initiated on 1/30 13. Right ankle sprain   Images are reviewed, showing edema   Encouraged icing   Continue boot per Ortho  LOS (Days) 3 A FACE TO FACE EVALUATION WAS PERFORMED  Ankit Lorie Phenix 01/04/2018 8:43 AM

## 2018-01-05 ENCOUNTER — Inpatient Hospital Stay (HOSPITAL_COMMUNITY): Payer: Medicaid Other | Admitting: Occupational Therapy

## 2018-01-05 ENCOUNTER — Inpatient Hospital Stay (HOSPITAL_COMMUNITY): Payer: Medicaid Other | Admitting: Physical Therapy

## 2018-01-05 DIAGNOSIS — S93401D Sprain of unspecified ligament of right ankle, subsequent encounter: Secondary | ICD-10-CM

## 2018-01-05 DIAGNOSIS — S72051D Unspecified fracture of head of right femur, subsequent encounter for closed fracture with routine healing: Secondary | ICD-10-CM

## 2018-01-05 NOTE — Progress Notes (Signed)
Occupational Therapy Session Note  Patient Details  Name: Lauren Matthews MRN: [id number] Date of Birth: 09/24/1976  Today's Date: 01/05/2018 OT Individual Time: 1345-1429 OT Individual Time Calculation (min): 44 min    Short Term Goals: Week 1:  OT Short Term Goal 1 (Week 1): STGs equal to LTGs set a supervision to modified independent level.   Skilled Therapeutic Interventions/Progress Updates:    Upon entering the room, pt seated in wheelchair awaiting therapist with no c/o pain. Pt agreeable to OT intervention. Skilled OT intervention with focus on community mobility task with use of RW. Pt propelled wheelchair 300' with supervision to gift shop. Pt operated B leg rest and standing from wheelchair with steady assistance while maintaining precautions. Pt ambulating 100' with RW all over gift shop with supervision for safety. Pt able to navigate aisles and small turns without issue but needing min verbal cues for safety with techniques. Pt seated in chair for rest break and then propelling wheelchair back to hospital room in same manner. Pt transferred from wheelchair >recliner chair with steady assistance. All needs within reach and visitors present.   Therapy Documentation Precautions:  Precautions Precautions: Posterior Hip Precaution Booklet Issued: Yes (comment) Precaution Comments: pt able to recall 3/3 precautions Required Braces or Orthoses: Other Brace/Splint Other Brace/Splint: CAM boot on R Restrictions Weight Bearing Restrictions: Yes RLE Weight Bearing: Touchdown weight bearing LLE Weight Bearing: Weight bearing as tolerated General:   Vital Signs:   Pain: Pain Assessment Pain Assessment: 0-10 Pain Score: 2  Pain Type: Surgical pain Pain Location: Leg Pain Orientation: Left Pain Descriptors / Indicators: Aching;Sore ADL:   Vision   Perception    Praxis   Exercises:   Other Treatments:    See Function Navigator for Current Functional  Status.   Therapy/Group: Individual Therapy  Gypsy Decant 01/05/2018, 2:30 PM

## 2018-01-05 NOTE — Progress Notes (Signed)
Physical Therapy Session Note  Patient Details  Name: Lauren Matthews MRN: [id number] Date of Birth: 1976-06-03  Today's Date: 01/05/2018 PT Individual Time: 1520-1630 PT Individual Time Calculation (min): 70 min   Short Term Goals: Week 1:  PT Short Term Goal 1 (Week 1): STG=LTG due to ELOS  Skilled Therapeutic Interventions/Progress Updates:   Pt in recliner and agreeable to therapy, 5/10 pain this afternoon. Transferred to w/c via stand pivot w/ RW and supervision. Emphasized pt performing w/c parts management independently this session when self-propelling to/from gym w/ supervision. Verbal, visual, and tactile cues for locking and unlocking foot rests. Practiced sit<>stands and negotiating 4" and 5" steps w/ min guard for safety in parallel bars. Performed 2x3 reps for each step size. Performed seated LE strengthening exercises as listed below during rest breaks. Transferred to edge of mat and to supine. Instructed pt on use of blue leg lifter for transitioning from sitting to supine. Additionally discussed using leg lifter to adjust leg in bed when feeling uncomfortable. Pt demo-ed correctly w/o increase in pain. Performed supine LE strengthening as listed below. Returned to EOB w/ verbal cues for use of leg lifter. Educated pt on maintaining surgical precautions while using leg lifter. Returned to w/c via stand pivot w/ RW. Pt self-propelled w/c towards room, ambulated 30' back to recliner in room w/ supervision using RW to work on endurance w/ gait. Ended session in recliner, call bell within reach and all needs met.   BLE strengthening exercises: -seated heel slides 2x10, L within tolerable pain range, R resisted orange TB -seated LAQs 2x30, L within tolerable pain range -supine L heel push 2x10  -supine L quad set 2x10  -supine R SLE 2x10   Therapy Documentation Precautions:  Precautions Precautions: Posterior Hip Precaution Booklet Issued: Yes (comment) Precaution Comments: pt  able to recall 3/3 precautions Required Braces or Orthoses: Other Brace/Splint Other Brace/Splint: CAM boot on R Restrictions Weight Bearing Restrictions: Yes RLE Weight Bearing: Touchdown weight bearing LLE Weight Bearing: Weight bearing as tolerated Pain: Pain Assessment Pain Assessment: 0-10 Pain Score: 5  Pain Type: Surgical pain Pain Location: Leg Pain Orientation: Left Pain Descriptors / Indicators: Aching;Sore  See Function Navigator for Current Functional Status.   Therapy/Group: Individual Therapy  Melroy Bougher K Arnette 01/05/2018, 4:37 PM

## 2018-01-05 NOTE — Progress Notes (Signed)
Occupational Therapy Session Note  Patient Details  Name: Lauren Matthews MRN: [id number] Date of Birth: 11-May-1976  Today's Date: 01/05/2018 OT Individual Time: 9983-3825 OT Individual Time Calculation (min): 75 min   Short Term Goals: Week 1:  OT Short Term Goal 1 (Week 1): STGs equal to LTGs set a supervision to modified independent level.   Skilled Therapeutic Interventions/Progress Updates:    Pt greeted supine in bed, increased pain in LEs however already medicated. Requesting to complete BADLs. Able to verbalize precautions and demonstrate carryover throughout session. Min A for guiding L LE off of bed for transition into sitting. Pt then ambulating into bathroom with RW and Min A (Rt CAM boot donned). Pt completed toileting tasks with steady assist and then transitioned into shower. Pt doffing clothing while seat, utilizing lateral leans as needed after she removed CAM boot. Pt bathing while seated, using LH sponge for LEs and lateral leans for perihygiene. Assist required for retrieval of dropped items on floor. Educated her on using adaptive method with reacher for drying L LE. Afterwards she completed grooming tasks with setup on bench, and then proceeded to dress. With AE and extra time, pt donning LB garments with steady assist overall while standing with RW. Pt also able to don Rt CAM boot with min vcs for maintaining trunk/LLE  extension while she brought R LE near abdomen. Pt then ambulated to w/c placed at sink to complete oral care and apply facial cream/oil. Provided her with reacher/walker bags during session and educated her on use. Also used music therapeutically to decrease pain perception and improve affect. At end of tx pt transferred to w/c and was left with all needs within reach and ice pack for Lt hip.   Therapy Documentation Precautions:  Precautions Precautions: Posterior Hip Precaution Booklet Issued: Yes (comment) Precaution Comments: pt able to recall 3/3  precautions Required Braces or Orthoses: Other Brace/Splint Other Brace/Splint: CAM boot on R Restrictions Weight Bearing Restrictions: Yes RLE Weight Bearing: Weight bearing as tolerated LLE Weight Bearing: Touchdown weight bearing Pain: L LE with movement    ADL:    See Function Navigator for Current Functional Status.   Therapy/Group: Individual Therapy  Latrish Mogel A Zafir Schauer 01/05/2018, 12:49 PM

## 2018-01-05 NOTE — Progress Notes (Signed)
[facility]IC[facility] RE[facility]ILITATION     PROGRESS NOTE  Subjective/Complaints:  Patient has some constipation, no abdominal pain.  No significant lower extremity pain.  Appreciate orthopedic note  ROS: Denies CP, SOB, nausea, vomiting, diarrhea.  Objective: Vital Signs: Blood pressure 114/73, pulse 69, temperature 98.5 F (36.9 C), temperature source Oral, resp. rate 16, height 5\' 6"  (1.676 m), weight 64 kg (141 lb 1.5 oz), last menstrual period 12/14/2017, SpO2 100 %. No results found. No results for input(s): WBC, HGB, HCT, PLT in the last 72 hours. No results for input(s): NA, K, CL, GLUCOSE, BUN, CREATININE, CALCIUM in the last 72 hours.  Invalid input(s): CO CBG (last 3)  No results for input(s): GLUCAP in the last 72 hours.  Wt Readings from Last 3 Encounters:  01/01/18 64 kg (141 lb 1.5 oz)  12/27/17 63.5 kg (140 lb)  07/27/17 65.8 kg (145 lb)    Physical Exam:  BP 114/73 (BP Location: Right Arm)   Pulse 69   Temp 98.5 F (36.9 C) (Oral)   Resp 16   Ht 5\' 6"  (1.676 m)   Wt 64 kg (141 lb 1.5 oz)   LMP 12/14/2017 (Exact Date)   SpO2 100%   BMI 22.77 kg/m  Gen: NAD. Well-developed.  HENT: Normocephalic. Atraumatic.  Eyes:EOMare normal. No discharge.  Cardiovascular:RRR. No JVD. Respiratory:Effort normal and breath sounds normal.  RK:YHCWC sounds are normal. She exhibitsno distension. Nontender to palpation Musculoskeletal:+TTP left knee, right ankle with edema, stable  Neurological: She isalertand oriented. Right upper extremity: 4/5 proximal to distal.  Left upper extremity: 5/5 proximal to distal.  Right lower extremity: 3/5 hip flexion and knee extension, 4/5 ankle dorsiflexion/plantarflexion (some pain inhibition) Left lower extremity: 2-/5 hip flexion, 3/5 ankle dorsiflexion/plantarflexion (some pain inhibition, unchanged).  Skin. Left lower extremitywound with dressing are clean and intact. Scattered hematomas  Assessment/Plan: 1.  Functional deficits secondary to polytrauma with left hip fracture which require 3+ hours per day of interdisciplinary therapy in a comprehensive inpatient rehab setting. Physiatrist is providing close team supervision and 24 hour management of active medical problems listed below. Physiatrist and rehab team continue to assess barriers to discharge/monitor patient progress toward functional and medical goals.  Function:  Bathing Bathing position   Position: Shower  Bathing parts Body parts bathed by patient: Right arm, Left arm, Chest, Abdomen, Front perineal area, Right upper leg, Left upper leg, Right lower leg, Left lower leg, Back, Buttocks Body parts bathed by helper: Buttocks  Bathing assist Assist Level: Supervision or verbal cues      Upper Body Dressing/Undressing Upper body dressing   What is the patient wearing?: Pull over shirt/dress     Pull over shirt/dress - Perfomed by patient: Thread/unthread right sleeve, Thread/unthread left sleeve, Put head through opening, Pull shirt over trunk          Upper body assist Assist Level: Set up      Lower Body Dressing/Undressing Lower body dressing   What is the patient wearing?: Pants, Non-skid slipper socks, AFO     Pants- Performed by patient: Thread/unthread right pants leg, Thread/unthread left pants leg, Pull pants up/down Pants- Performed by helper: Pull pants up/down Non-skid slipper socks- Performed by patient: Don/doff right sock, Don/doff left sock Non-skid slipper socks- Performed by helper: Don/doff right sock, Don/doff left sock         AFO - Performed by patient: Don/doff right AFO        Lower body assist Assist for lower  body dressing: (max assist)      Toileting Toileting   Toileting steps completed by patient: Adjust clothing prior to toileting, Performs perineal hygiene, Adjust clothing after toileting      Toileting assist Assist level: Touching or steadying assistance (Pt.75%)    Transfers Chair/bed transfer   Chair/bed transfer method: Stand pivot, Ambulatory Chair/bed transfer assist level: Touching or steadying assistance (Pt > 75%) Chair/bed transfer assistive device: Armrests, Medical sales representative     Max distance: 40' Assist level: Supervision or verbal cues   Wheelchair   Type: Manual Max wheelchair distance: 150 ft Assist Level: Supervision or verbal cues  Cognition Comprehension Comprehension assist level: Follows complex conversation/direction with no assist  Expression Expression assist level: Expresses complex ideas: With no assist  Social Interaction Social Interaction assist level: Interacts appropriately with others with medication or extra time (anti-anxiety, antidepressant).(daily antidepressant)  Problem Solving Problem solving assist level: Solves complex problems: Recognizes & self-corrects  Memory Memory assist level: Complete Independence: No helper     Medical Problem List and Plan: 1.Decreased functional mobilitysecondary to left Pipkin II femoral head fracture dislocation and left knee wound. Status post I&Dof left knee, open treatment of hip dislocation, ORIF femoral head fracture 12/27/2017. Touchdown weightbearing with posterior hip precautions   Cont CIR PT, OT 2. DVT Prophylaxis/Anticoagulation: Monitor for any bleeding episodes.    Vascular study with left peroneal DVT, continue Xarelto 3. Pain Management:Robaxin and oxycodone as needed   Lidoderm patch added to left hip 4. Mood:Celexa 10 mg daily. Provide emotional support 5. Neuropsych: This patientiscapable of making decisions on herown behalf. 6. Skin/Wound Care:Routine skin checks 7. Fluids/Electrolytes/Nutrition:Routine I&O's    BMP within acceptable range on 1/30   Labs ordered for Monday 8.Acute blood loss anemia.    Hemoglobin 9.7 on 1/30   Labs ordered for Monday   Continue to monitor, no dizziness or orthostatic symptoms, no  tachycardia 9.History of hypertension.HCTZ 12.5 mg daily   Controlled on 2/1 10.Tobacco abuse. Counseling 11.Constipation. Laxative assistance   Bowel meds increased on 1/31, may need sorbitol today 12. Hypoalbuminemia   Supplement initiated on 1/30 13. Right ankle sprain, peroneal tendons   Images are reviewed, showing edema   Encouraged icing   Continue boot per Ortho  LOS (Days) 4 A FACE TO FACE EVALUATION WAS PERFORMED  Charlett Blake 01/05/2018 7:46 AM
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
## 2018-01-07 ENCOUNTER — Inpatient Hospital Stay (HOSPITAL_COMMUNITY): Payer: Medicaid Other

## 2018-01-07 ENCOUNTER — Inpatient Hospital Stay (HOSPITAL_COMMUNITY): Payer: Medicaid Other | Admitting: Occupational Therapy

## 2018-01-07 ENCOUNTER — Encounter (HOSPITAL_COMMUNITY): Payer: Medicaid Other | Admitting: Psychology

## 2018-01-07 DIAGNOSIS — K5903 Drug induced constipation: Secondary | ICD-10-CM

## 2018-01-07 DIAGNOSIS — S72051S Unspecified fracture of head of right femur, sequela: Secondary | ICD-10-CM

## 2018-01-07 LAB — BASIC METABOLIC PANEL
Anion gap: 12 (ref 5–15)
BUN: 14 mg/dL (ref 6–20)
CO2: 26 mmol/L (ref 22–32)
CREATININE: 0.79 mg/dL (ref 0.44–1.00)
Calcium: 9.1 mg/dL (ref 8.9–10.3)
Chloride: 97 mmol/L — ABNORMAL LOW (ref 101–111)
GFR calc non Af Amer: >60 mL/min (ref >60)
Glucose, Bld: 90 mg/dL (ref 65–99)
POTASSIUM: 4 mmol/L (ref 3.5–5.1)
SODIUM: 135 mmol/L (ref 135–145)

## 2018-01-07 LAB — CBC WITH DIFFERENTIAL/PLATELET
Basophils Absolute: 0 10*3/uL (ref 0.0–0.1)
Basophils Relative: 0 %
Eosinophils Absolute: 0.1 10*3/uL (ref 0.0–0.7)
Eosinophils Relative: 1 %
HEMATOCRIT: 33.8 % — AB (ref 36.0–46.0)
HEMOGLOBIN: 11.1 g/dL — AB (ref 12.0–15.0)
LYMPHS ABS: 1.7 10*3/uL (ref 0.7–4.0)
Lymphocytes Relative: 26 %
MCH: 32.5 pg (ref 26.0–34.0)
MCHC: 32.8 g/dL (ref 30.0–36.0)
MCV: 98.8 fL (ref 78.0–100.0)
MONOS PCT: 10 %
Monocytes Absolute: 0.6 10*3/uL (ref 0.1–1.0)
NEUTROS PCT: 63 %
Neutro Abs: 3.9 10*3/uL (ref 1.7–7.7)
Platelets: 335 10*3/uL (ref 150–400)
RBC: 3.42 MIL/uL — AB (ref 3.87–5.11)
RDW: 13.4 % (ref 11.5–15.5)
WBC: 6.3 10*3/uL (ref 4.0–10.5)
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
## 2018-01-07 NOTE — Progress Notes (Signed)
Occupational Therapy Session Note  Patient Details  Name: Lauren Matthews MRN: [id number] Date of Birth: 1976/02/17  Today's Date: 01/07/2018 OT Individual Time: 1447-1530 OT Individual Time Calculation (min): 43 min    Short Term Goals: Week 1:  OT Short Term Goal 1 (Week 1): STGs equal to LTGs set a supervision to modified independent level.   Skilled Therapeutic Interventions/Progress Updates:    Pt completed tub/shower transfers using the tub bench, in the ADL apartment.  Supervision for ambulation from wheelchair in the apartment into the bathroom with use of the RW.  She then needed min assist for lifting the LLE over the edge of the tub on her first attempt, but then could complete this with use of the leg lifter on the second attempt with supervision.  Also had her practice transfers to the regular bed with min guard assist and use of the leg lifter.  She transferred back to the wheelchair with supervision to complete session.  She then returned to the room via wheelchair with call button and phone in reach.    Therapy Documentation Precautions:  Precautions Precautions: Posterior Hip Precaution Booklet Issued: Yes (comment) Precaution Comments: pt able to recall 3/3 precautions Required Braces or Orthoses: Other Brace/Splint Other Brace/Splint: CAM boot on R Restrictions Weight Bearing Restrictions: Yes RLE Weight Bearing: Weight bearing as tolerated LLE Weight Bearing: Touchdown weight bearing   Pain: Pain Assessment Pain Assessment: Faces Pain Score: 5  Faces Pain Scale: Hurts a little bit Pain Type: Acute pain Pain Location: Hip Pain Orientation: Left Pain Descriptors / Indicators: Aching Pain Frequency: Intermittent Pain Onset: With Activity Pain Intervention(s): Repositioned ADL: See Function Navigator for Current Functional Status.   Therapy/Group: Individual Therapy  Lauren Matthews OTR/L 01/07/2018, 3:58 PM

## 2018-01-07 NOTE — Progress Notes (Signed)
Physical Therapy Session Note  Patient Details  Name: Lauren Matthews MRN: [id number] Date of Birth: 1975-12-13  Today's Date: 01/07/2018 PT Individual Time: 1002-1100, 1345-1430 PT Individual Time Calculation (min): 58 min , 45 min   Short Term Goals: Week 1:  PT Short Term Goal 1 (Week 1): STG=LTG due to ELOS  Skilled Therapeutic Interventions/Progress Updates:    Session 1: Pt seated in w/c upon PT arrival, agreeable to therapy tx and reports pain 4/10. Pt propelled w/c from room<>gym x 150 ft with supervision. Pt performed squat pivot transfers from w/c<>mat x 3 to increase independence at home without RW with supervision, verbal cues for techniques and maintaining precautions. Pt ascended/descended 1 step (6 inches) x 2 using B handrails and min assist, pt reports increased pain in R ankle during this, discussed pros/cons of this method vs stair bumping. Pt propelled w/c from room>rehab apartment, discussed leaving legs rests off at home for increased accessibility. Pt performed squat pivot transfer from w/c<>couch with min assist, discussed scooting towards the stronger side (R) to get on/off couch at home in order to make it easier. Pt performed stand pivot transfer from w/c<>bed with RW and supervision, verbal cues for techniques. Pt propelled w/c back to room and transferred to recliner with supervision, left seated in recliner with needs in reach.  Session 2: Pt seated in w/c upon PT arrival, agreeable to therapy tx and denies pain at rest. Pt propelled w/c within unit >100 ft with supervision using B UEs. Pt performed w/c<>car transfer using RW for stand pivot, min assist in order to help lift/lower L LE into the car. Pt educated on importance of HEP including ROM exercises and stretching. Therapist performed 3 x 30 sec manual knee flexion stretches, 3 x 30 sec manual knee extension stretches, 2 x 10 LAQ with emphasis on 5 sec hold during knee extension and 2 x 10 heel slides while seated in  w/c. Pt transported back to room and left seated in w/c with needs in reach.   Therapy Documentation Precautions:  Precautions Precautions: Posterior Hip Precaution Booklet Issued: Yes (comment) Precaution Comments: pt able to recall 3/3 precautions Required Braces or Orthoses: Other Brace/Splint Other Brace/Splint: CAM boot on R Restrictions Weight Bearing Restrictions: Yes RLE Weight Bearing: Weight bearing as tolerated(with CAM boot oob) LLE Weight Bearing: Touchdown weight bearing   See Function Navigator for Current Functional Status.   Therapy/Group: Individual Therapy  Netta Corrigan, PT, DPT 01/07/2018, 7:58 AM

## 2018-01-07 NOTE — Progress Notes (Signed)
Occupational Therapy Session Note  Patient Details  Name: Lauren Matthews MRN: [id number] Date of Birth: [birth year]  Today's Date: 01/07/2018 OT Individual Time: 1884-1660 OT Individual Time Calculation (min): 57 min    Short Term Goals: Week 1:  OT Short Term Goal 1 (Week 1): STGs equal to LTGs set a supervision to modified independent level.   Skilled Therapeutic Interventions/Progress Updates:    Pt completed shower transfer with use of the RW and close supervision.  She completed bathing in sitting with lateral leans for washing peri area and for drying.  Squat pivot transfer to the wheelchair for dressing tasks secondary to not having the cam boot on the right foot.  She was able to donn socks with the sockaide and then donn her pants over her feet using the reacher, in order to adhere to her hip precautions.  She donned the cam boot on the right foot by bringing the foot up as to not break her precautions.  Supervision for sit to stand in order to pull her pants over her hips.  Finished session with pt in wheelchair and her mother present as well.  Discussed expectations for 3:1 and tub bench for home.    Therapy Documentation Precautions:  Precautions Precautions: Posterior Hip Precaution Booklet Issued: Yes (comment) Precaution Comments: pt able to recall 3/3 precautions Required Braces or Orthoses: Other Brace/Splint Other Brace/Splint: CAM boot on R Restrictions Weight Bearing Restrictions: Yes RLE Weight Bearing: Weight bearing as tolerated LLE Weight Bearing: Touchdown weight bearing   Pain: Pain Assessment Pain Assessment: Faces Pain Score: 6  Faces Pain Scale: Hurts little more Pain Type: Acute pain Pain Location: Leg Pain Orientation: Left Pain Descriptors / Indicators: Aching Pain Frequency: Constant Pain Onset: On-going Pain Intervention(s): Emotional support;RN made aware ADL: See Function Navigator for Current Functional Status.   Therapy/Group: Individual  Therapy  Shizuo Biskup  OTR/L 01/07/2018, 9:37 AM

## 2018-01-07 NOTE — Progress Notes (Signed)
Patient was in rehab with Jeneen Rinks in the apartment rehab room.  I was able to notify her that I am unable to notarize personal documents that have nothing to do with Advanced Directives.  Patient states that she understood and she would proceed with securing someone outside of the hospital.      01/07/18 1516  Clinical Encounter Type  Visited With Patient;Health care provider  Visit Type Initial;Spiritual support

## 2018-01-08 ENCOUNTER — Inpatient Hospital Stay (HOSPITAL_COMMUNITY): Payer: Medicaid Other | Admitting: Occupational Therapy

## 2018-01-08 ENCOUNTER — Inpatient Hospital Stay (HOSPITAL_COMMUNITY): Payer: Medicaid Other | Admitting: Physical Therapy

## 2018-01-08 ENCOUNTER — Ambulatory Visit (HOSPITAL_COMMUNITY): Payer: Medicaid Other

## 2018-01-08 LAB — CREATININE, SERUM
Creatinine, Ser: 0.74 mg/dL (ref 0.44–1.00)
GFR calc Af Amer: >60 mL/min (ref >60)
GFR calc non Af Amer: >60 mL/min (ref >60)

## 2018-01-08 NOTE — Progress Notes (Signed)
Social Work Patient ID: Lauren Matthews, female   DOB: 05-21-1976, 42 y.o.   MRN: [id number] met with pt to discuss discharge needs. Have ordered wheelchair and rolling walker, Mom have gotten the bedside commode and tub bench for her. Referral made to [facility] for equipment and follow up home health if eligible for. She feels good about going home on Thursday.

## 2018-01-08 NOTE — Progress Notes (Addendum)
Physical Therapy Session Note  Patient Details  Name: Lauren Matthews MRN: [id number] Date of Birth: 23-Sep-1976  Today's Date: 01/08/2018 PT Individual Time: 0130-0200 PT Individual Time Calculation (min): 30 min   Short Term Goals: Week 1:  PT Short Term Goal 1 (Week 1): STG=LTG due to ELOS  Skilled Therapeutic Interventions/Progress Updates:   Pt supine and agreeable to therapy. Pain as detailed below. Transferred to EOB and to w/c via stand pivot w/ RW w/ supervision. Pt self-propelled w/c to/from therapy gym w/ supervision to work on functional endurance. Practiced negotiating 4 steps w/ min guard while utilizing both rails to maintain TTWB on LLE. Max verbal encouragement and cues for hand placement, pt w/ increased fear and anxiety of falling. She however had sufficient UE strength and steps were slow and controlled. Reinforced this to her stating that she does have the strength to perform stairs. Pt c/o increasing R ankle pain over last few days. Educated pt on signs of soft tissue injury including swelling and bruising, both of which she has inferior to medial and lateral malleoli Discussed how increased pain over last few days can be expected as this extremity is compensating for LLE, and having to compensate w/ injuries itself as per chart review. Educated her on importance of rest, ice, immobilization, and elevation for swelling and soft tissue injuries. Pt appreciative of education. Ended session in w/c w/o CAM boot so ice can be applied to lateral ankle, propped up on leg rest. Call bell within reach and all needs met, RN made aware.   Therapy Documentation Precautions:  Precautions Precautions: Posterior Hip Precaution Booklet Issued: Yes (comment) Precaution Comments: pt able to recall 3/3 precautions Required Braces or Orthoses: Other Brace/Splint Other Brace/Splint: CAM boot on R Restrictions Weight Bearing Restrictions: Yes RLE Weight Bearing: Weight bearing as tolerated LLE  Weight Bearing: Touchdown weight bearing Pain: Pain Assessment Pain Assessment: 0-10 Pain Score: 3  Pain Location: Leg Pain Orientation: Left Patients Stated Pain Goal: 2 Pain Intervention(s): Medication (See eMAR);Repositioned  See Function Navigator for Current Functional Status.   Therapy/Group: Individual Therapy  Asjia Berrios K Arnette 01/08/2018, 2:13 PM

## 2018-01-08 NOTE — Progress Notes (Signed)
Occupational Therapy Session Note  Patient Details  Name: Lauren Matthews MRN: [id number] Date of Birth: [birth year]  Today's Date: 01/08/2018 OT Individual Time: 6812-7517 OT Individual Time Calculation (min): 40 min    Short Term Goals: Week 1:  OT Short Term Goal 1 (Week 1): STGs equal to LTGs set a supervision to modified independent level.   Skilled Therapeutic Interventions/Progress Updates:    Pt completed wheelchair mobility to the ADL kitchen with supervision.  Had pt work on simple meal prep of frying an egg with use of the RW during session.  She was able to complete task with supervision using the RW but fatigued quickly.  Discussed use of the wheelchair for meal prep to conserve energy as well as sitting to complete tasks that take longer or when something needs to be monitored.  She demonstrated understanding.  Finished session with return to the room via wheelchair with call button and phone in reach.    Therapy Documentation Precautions:  Precautions Precautions: Posterior Hip Precaution Booklet Issued: Yes (comment) Precaution Comments: pt able to recall 3/3 precautions Required Braces or Orthoses: Other Brace/Splint Other Brace/Splint: CAM boot on R Restrictions Weight Bearing Restrictions: Yes RLE Weight Bearing: Weight bearing as tolerated LLE Weight Bearing: Touchdown weight bearing  Pain: Pain Assessment Pain Assessment: Faces Pain Score: 3  Faces Pain Scale: Hurts little more Pain Type: Surgical pain Pain Location: Leg Pain Orientation: Left Pain Onset: With Activity Patients Stated Pain Goal: 2 Pain Intervention(s): Repositioned ADL: See Function Navigator for Current Functional Status.   Therapy/Group: Individual Therapy  Nassim Cosma OTR/L  01/08/2018, 3:59 PM

## 2018-01-08 NOTE — Progress Notes (Signed)
[facility]     PROGRESS NOTE  Subjective/Complaints:  Pt seen lying in bed this AM.  She slept well overnigth.  She notes improvement in strength.   ROS: Denies CP, SOB, nausea, vomiting, diarrhea.  Objective: Vital Signs: Blood pressure 117/80, pulse 67, temperature 98.1 F (36.7 C), temperature source Oral, resp. rate 18, height 5\' 6"  (1.676 m), weight 64 kg (141 lb 1.5 oz), last menstrual period 12/14/2017, SpO2 100 %. No results found. Recent Labs    01/07/18 0755  WBC 6.3  HGB 11.1*  HCT 33.8*  PLT 335   Recent Labs    01/07/18 0755 01/08/18 0419  NA 135  --   K 4.0  --   CL 97*  --   GLUCOSE 90  --   BUN 14  --   CREATININE 0.79 0.74  CALCIUM 9.1  --    CBG (last 3)  No results for input(s): GLUCAP in the last 72 hours.  Wt Readings from Last 3 Encounters:  01/01/18 64 kg (141 lb 1.5 oz)  12/27/17 63.5 kg (140 lb)  07/27/17 65.8 kg (145 lb)    Physical Exam:  BP 117/80 (BP Location: Left Arm)   Pulse 67   Temp 98.1 F (36.7 C) (Oral)   Resp 18   Ht 5\' 6"  (1.676 m)   Wt 64 kg (141 lb 1.5 oz)   LMP 12/14/2017 (Exact Date)   SpO2 100%   BMI 22.77 kg/m  Gen: NAD. Well-developed.  HENT: Normocephalic. Atraumatic.  Eyes:EOMare normal. No discharge.  Cardiovascular:RRR. No JVD. Respiratory:Effort normal and breath sounds normal.  ZO:XWRUE sounds are normal. She exhibitsno distension. Nontender to palpation Musculoskeletal: +TTP left hip. Right ankle with edema, improving Stable no external rotation or shortening Neurological: She isalertand oriented. Right upper extremity: 4/5 proximal to distal.  Left upper extremity: 5/5 proximal to distal.  Right lower extremity: 4/5 hip flexion and knee extension, 4+/5 ankle dorsiflexion/plantarflexion (some pain inhibition) Left lower extremity: 3-/5 hip flexion, 3/5 ankle dorsiflexion/plantarflexion (some pain inhibition).  Skin. Incision  c/d/i.  Assessment/Plan: 1. Functional deficits secondary to polytrauma with left hip fracture which require 3+ hours per day of interdisciplinary therapy in a comprehensive inpatient rehab setting. Physiatrist is providing close team supervision and 24 hour management of active medical problems listed below. Physiatrist and rehab team continue to assess barriers to discharge/monitor patient progress toward functional and medical goals.  Function:  Bathing Bathing position   Position: Shower  Bathing parts Body parts bathed by patient: Right arm, Left arm, Chest, Abdomen, Front perineal area, Right upper leg, Left upper leg, Right lower leg, Back, Buttocks Body parts bathed by helper: Buttocks  Bathing assist Assist Level: Supervision or verbal cues      Upper Body Dressing/Undressing Upper body dressing   What is the patient wearing?: Pull over shirt/dress, Bra Bra - Perfomed by patient: Thread/unthread right bra strap, Thread/unthread left bra strap, Hook/unhook bra (pull down sports bra)   Pull over shirt/dress - Perfomed by patient: Thread/unthread right sleeve, Thread/unthread left sleeve, Put head through opening, Pull shirt over trunk          Upper body assist Assist Level: Set up      Lower Body Dressing/Undressing Lower body dressing   What is the patient wearing?: Pants, Non-skid slipper socks, AFO     Pants- Performed by patient: Thread/unthread right pants leg, Thread/unthread left pants leg, Pull pants up/down Pants- Performed by helper: Pull pants up/down Non-skid  slipper socks- Performed by patient: Don/doff right sock, Don/doff left sock Non-skid slipper socks- Performed by helper: Don/doff right sock, Don/doff left sock         AFO - Performed by patient: Don/doff right AFO        Lower body assist Assist for lower body dressing: Supervision or verbal cues      Toileting Toileting   Toileting steps completed by patient: Adjust clothing prior to  toileting, Performs perineal hygiene, Adjust clothing after toileting      Toileting assist Assist level: Supervision or verbal cues   Transfers Chair/bed transfer   Chair/bed transfer method: Stand pivot, Ambulatory Chair/bed transfer assist level: Supervision or verbal cues Chair/bed transfer assistive device: Armrests, Medical sales representative     Max distance: 30' Assist level: Supervision or verbal cues   Wheelchair   Type: Manual Max wheelchair distance: 150 ft Assist Level: Supervision or verbal cues  Cognition Comprehension Comprehension assist level: Follows complex conversation/direction with no assist  Expression Expression assist level: Expresses complex ideas: With no assist  Social Interaction Social Interaction assist level: Interacts appropriately with others with medication or extra time (anti-anxiety, antidepressant).  Problem Solving Problem solving assist level: Solves complex problems: Recognizes & self-corrects  Memory Memory assist level: Complete Independence: No helper     Medical Problem List and Plan: 1.Decreased functional mobilitysecondary to left Pipkin II femoral head fracture dislocation and left knee wound. Status post I&Dof left knee, open treatment of hip dislocation, ORIF femoral head fracture 12/27/2017. Touchdown weightbearing with posterior hip precautions   Cont CIR, making slow progress 2. DVT Prophylaxis/Anticoagulation: Monitor for any bleeding episodes.    Vascular study with left peroneal DVT, continue Xarelto 3. Pain Management:Robaxin and oxycodone as needed   Lidoderm patch added to left hip 4. Mood:Celexa 10 mg daily. Provide emotional support 5. Neuropsych: This patientiscapable of making decisions on herown behalf. 6. Skin/Wound Care:Routine skin checks 7. Fluids/Electrolytes/Nutrition:Routine I&O's    BMP within acceptable range on 2/4 8.Acute blood loss anemia.    Hemoglobin 11.1 on 2/4   Continue to  monitor 9.History of hypertension.HCTZ 12.5 mg daily    Vitals:   01/07/18 2045 01/08/18 0715  BP:  117/80  Pulse:  67  Resp:  18  Temp: 98 F (36.7 C) 98.1 F (36.7 C)  SpO2:  100%    Relatively controlled 2/5 10.Tobacco abuse. Counseling 11.Constipation. Laxative assistance   Bowel meds increased on 1/31  12. Hypoalbuminemia   Supplement initiated on 1/30 13. Right ankle sprain, peroneal tendons    Encouraged icing   Continue boot per Ortho  LOS (Days) 7 A FACE TO FACE EVALUATION WAS PERFORMED  Lakai Moree Lorie Phenix 01/08/2018 8:37 AM

## 2018-01-08 NOTE — Progress Notes (Signed)
Occupational Therapy Session Note  Patient Details  Name: Lauren Matthews MRN: [id number] Date of Birth: 04/25/1976  Today's Date: 01/08/2018 OT Individual Time: 0800-0900 OT Individual Time Calculation (min): 60 min    Short Term Goals: Week 1:  OT Short Term Goal 1 (Week 1): STGs equal to LTGs set a supervision to modified independent level.   Skilled Therapeutic Interventions/Progress Updates:    Pt completed supine to sit EOB with supervision and use of the leg lifter to advance the LLE off of the EOB.  Prior to this, she was able to donn her cam boot with setup as well.  She then ambulated to the shower with use of the RW and supervision.  Bathing in sitting with lateral leans side to side for washing peri area as well as use of the LH sponge for washing and drying the left foot.  She then transferred squat pivot to the wheelchair from the tub bench with supervision based on shower setup.  Dressing sit to stand from the wheelchair with supervision to complete session.  Sockaide integrated as well as reacher for adhering to hip precautions when attempting to dress the LLE.  Pt left with call button and phone in reach.    Therapy Documentation Precautions:  Precautions Precautions: Posterior Hip Precaution Booklet Issued: Yes (comment) Precaution Comments: pt able to recall 3/3 precautions Required Braces or Orthoses: Other Brace/Splint Other Brace/Splint: CAM boot on R Restrictions Weight Bearing Restrictions: Yes RLE Weight Bearing: Weight bearing as tolerated LLE Weight Bearing: Touchdown weight bearing  Pain: Pain Assessment Pain Assessment: Faces Pain Score: Asleep Faces Pain Scale: Hurts little more Pain Type: Surgical pain Pain Location: Hip Pain Orientation: Left Pain Descriptors / Indicators: Discomfort Pain Onset: On-going Pain Intervention(s): Repositioned ADL: See Function Navigator for Current Functional Status.   Therapy/Group: Individual  Therapy  Sekou Zuckerman OTR/L 01/08/2018, 9:34 AM

## 2018-01-08 NOTE — Progress Notes (Signed)
Physical Therapy Session Note  Patient Details  Name: Lauren Matthews MRN: [id number] Date of Birth: [birth year]  Today's Date: 01/08/2018 PT Individual Time: 4975-3005 PT Individual Time Calculation (min): 56 min   Short Term Goals: Week 1:  PT Short Term Goal 1 (Week 1): STG=LTG due to ELOS  Skilled Therapeutic Interventions/Progress Updates:    Pt seated in w/c upon PT arrival, agreeable to therapy tx and reports pain 5/10. Pt's family present this session for family training. Pt propelled w/c throughout unit with supervision using B UEs. Therapist educated pt and family (mom, dad, son) on stair bumping technique and family performed 2 steps x 2 trials with occasional verbal cues. Pt unable to ascend/descend steps at this time secondary to R ankle pain, therapist encouraged pt to use stair bumping technique at home secondary R ankle pain. Pt propelled w/c to ortho gym and performed car transfer with supervision on RW. Therapist educated family on w/c parts management and how to fold up w/c. Therapist performed manual L knee ROM and stretching for knee flexion and extension. Pt performed x 10 LAQ with L LE. Pt and family educated on home exercises and hip precautions, no further questions. Pt left seated in w/c at end of session with needs in reach.   Therapy Documentation Precautions:  Precautions Precautions: Posterior Hip Precaution Booklet Issued: Yes (comment) Precaution Comments: pt able to recall 3/3 precautions Required Braces or Orthoses: Other Brace/Splint Other Brace/Splint: CAM boot on R Restrictions Weight Bearing Restrictions: Yes RLE Weight Bearing: Weight bearing as tolerated(with CAM boot oob) LLE Weight Bearing: Touchdown weight bearing   See Function Navigator for Current Functional Status.   Therapy/Group: Individual Therapy  Netta Corrigan, PT, DPT 01/08/2018, 7:48 AM

## 2018-01-09 ENCOUNTER — Inpatient Hospital Stay (HOSPITAL_COMMUNITY): Payer: Medicaid Other | Admitting: Occupational Therapy

## 2018-01-09 ENCOUNTER — Inpatient Hospital Stay (HOSPITAL_COMMUNITY): Payer: Medicaid Other
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
## 2018-01-09 NOTE — Progress Notes (Signed)
Physical Therapy Discharge Summary  Patient Details  Name: Lauren Matthews MRN: [id number] Date of Birth: [birth year]  Today's Date: 01/09/2018 PT Individual Time: 1001-1100, 1430-1615 PT Individual Time Calculation (min): 59 min , 45 min    Patient has met 7 of 7 long term goals due to improved activity tolerance, increased strength, increased range of motion and decreased pain.  Patient to discharge at a wheelchair level Modified Independent.   Patient's family (mom, dad, son) has completed family training in order to perform wheelchair stair bumping x 3 steps. The pt is able to perform steps with supervision  And bilateral handrails, however is limited by R ankle pain.   All Goals Met.   Recommendation:  Patient will benefit from ongoing skilled PT services in home health setting to continue to advance safe functional mobility, address ongoing impairments in ROM, strength, mobility, and minimize fall risk.  Equipment: w/c and RW  Reasons for discharge: treatment goals met  Patient/family agrees with progress made and goals achieved: Yes   PT Treatment Interventions: Session 1: Pt seated in w/c upon PT arrival, agreeable to therapy tx and reports pain 3/10 as described below. Discharge summary completed this session with focus on functional mobility, education and HEP. Pt propelled w/c throughout unit Mod I >150 ft using B UEs. Pt completes 4 steps with B handrails and supervision, limited by R ankle pain and has been educated to have family perform w/c stair bumping if too painful. Pt performed all transfers including squat pivot and stand pivot with RW, Mod I. Pt performed all bed mobility Mod I using a leg lifter to help lift L LE. Reviewed HEP to include 2 x 10 of each: supine heel slides, hip abduction, SLR, isometric quadriceps, SAQ, ankle pumps; sitting LAQ, heel slides; standing hip abduction, hip extension and hip flexion with RW. Pt performed gait with RW and with crutches requiring  supervision and verbal cues for techniques. Pt left seated in w/c at end of session with needs in reach.   Session 2:  Pt seated in w/c upon PT arrival, agreeable to therapy tx and denies pain. Therapist adjusted pt's new w/c and RW, wrong size w/c delivered and L Leg rest does not lock into place (case manager aware). Pt propelled w/c throughout unit Mod I. Pt ambulated x 20 ft Mod I using RW. Reviewed home exercise program and provided pt with handout. Pt performed heel slides while seated in w/c for L knee ROM and performed isometric quad contractions x 10 for knee extension. Pt and therapist discussed d/c questions and concerns, pt states she is ready to go home and has no further questions. Pt propelled w/c back to room and transferred to recliner Mod I, squat pivot. Applied ice and elevated R ankle for pain relief. Pt left seated in recliner at end of session with needs in reach.   PT Discharge Precautions/Restrictions Precautions Precautions: Posterior Hip Precaution Booklet Issued: Yes (comment) Precaution Comments: pt able to recall 3/3 precautions Required Braces or Orthoses: Other Brace/Splint Other Brace/Splint: CAM boot on R Restrictions Weight Bearing Restrictions: Yes RLE Weight Bearing: Weight bearing as tolerated LLE Weight Bearing: Touchdown weight bearing Pain Pain Assessment Pain Assessment: 0-10 Pain Score: 3  Faces Pain Scale: Hurts a little bit Pain Type: Acute pain Pain Location: Ankle Pain Orientation: Left Pain Descriptors / Indicators: Discomfort Pain Onset: Gradual Pain Intervention(s): RN made aware;Refused Multiple Pain Sites: No Vision/Perception  Perception Perception: Within Functional Limits Praxis Praxis: Intact  Cognition  Overall Cognitive Status: Within Functional Limits for tasks assessed Arousal/Alertness: Awake/alert Orientation Level: Oriented X4 Attention: Sustained;Selective Focused Attention: Appears intact Sustained Attention:  Appears intact Selective Attention: Appears intact Memory: Appears intact Awareness: Appears intact Problem Solving: Appears intact Safety/Judgment: Appears intact Sensation Sensation Light Touch: Impaired by gross assessment(L outer hip impaired to fine touch) Stereognosis: Appears Intact Hot/Cold: Appears Intact Proprioception: Appears Intact Additional Comments: numbness in L outer hip Coordination Gross Motor Movements are Fluid and Coordinated: No Fine Motor Movements are Fluid and Coordinated: No Coordination and Movement Description: B LE coordination impaired secondary to limited ROM and pain Motor  Motor Motor: Within Functional Limits Motor - Discharge Observations: generalized weakness, limited by pain  Trunk/Postural Assessment  Cervical Assessment Cervical Assessment: Within Functional Limits Thoracic Assessment Thoracic Assessment: Within Functional Limits Lumbar Assessment Lumbar Assessment: Within Functional Limits Postural Control Postural Control: Within Functional Limits  Balance Balance Balance Assessed: Yes Static Sitting Balance Static Sitting - Balance Support: Feet supported Static Sitting - Level of Assistance: 7: Independent Dynamic Sitting Balance Dynamic Sitting - Balance Support: No upper extremity supported;During functional activity Dynamic Sitting - Level of Assistance: 6: Modified independent (Device/Increase time) Static Standing Balance Static Standing - Balance Support: During functional activity Static Standing - Level of Assistance: 6: Modified independent (Device/Increase time) Dynamic Standing Balance Dynamic Standing - Balance Support: During functional activity Dynamic Standing - Level of Assistance: 6: Modified independent (Device/Increase time) Extremity Assessment  RUE Assessment RUE Assessment: Within Functional Limits LUE Assessment LUE Assessment: Within Functional Limits RLE Assessment RLE Assessment: Exceptions to  WFL(ankle ROM limited by CAM boot, DF to neurtral. strength grossly 4/5 at hip and knee) LLE Assessment LLE Assessment: Exceptions to WFL LLE AROM (degrees) LLE Overall AROM Comments: Knee flexion to 80 deg, knee extension lacking 8 deg LLE PROM (degrees) LLE Overall PROM Comments: knee flexion to 87 deg, knee extension lacking 2 deg LLE Strength LLE Overall Strength Comments: Strength grossly 3/5 at the hip and knee (limited by pain and ROM), strength 4/5 at the ankle   See Function Navigator for Current Functional Status.  Netta Corrigan, PT, DPT 01/09/2018, 11:01 AM

## 2018-01-09 NOTE — Progress Notes (Signed)
Occupational Therapy Note  Patient Details  Name: Lauren Matthews MRN: [id number] Date of Birth: 03/02/1976  Today's Date: 01/09/2018 OT Individual Time: 1130-1155 OT Individual Time Calculation (min): 25 min   Pt denies pain Individual Therapy  Pt resting in w/c upon arrival.  Pt made mod I in room prior to therapy session.  OT intervention with focus on ongoing discharge planning, home safety, and standing balance with RW.  Pt recalled 3/3 hip precautions and LLE weight bearing precautions/status.  Pt pleased with progress and ready for discharge tomorrow.    Leotis Shames [facility] 01/09/2018, 2:51 PM

## 2018-01-09 NOTE — Progress Notes (Signed)
Occupational Therapy Discharge Summary  Patient Details  Name: Lauren Matthews MRN: [id number] Date of Birth: 1976/11/19  Today's Date: 01/09/2018 OT Individual Time: 9509-3267 OT Individual Time Calculation (min): 59 min    Session Note:  Pt completed transfer to the shower with use of the RW and supervision.  She was able to remove all clothing and complete bathing at modified independent level with use of the reacher and the LH sponge.  Dressing sit to stand from the wheelchair with use of AE as well.  She is able to bring her RLE up far enough to donn all clothing and cam boot without breaking her hip precautions.  Pt's mother present for session as well.  Pt left at the sink for completion of grooming tasks at end of session.    Patient has met 9 of 9 long term goals due to improved activity tolerance, improved balance and ability to compensate for deficits.  Patient to discharge at overall Supervision level.  Patient's care partner is independent to provide the necessary physical assistance at discharge.    Reasons goals not met: NA  Recommendation:  Patient will benefit from ongoing skilled OT services in home health setting to continue to advance functional skills in the area of iADL, Vocation and Reduce care partner burden.  Feel pt will benefit from [facility] eval for safety in familiar environment.  She is approaching modified independent level for most selfcare tasks but needs more practice in home environment for consistency.   Equipment: No equipment provided  Reasons for discharge: treatment goals met and discharge from hospital  Patient/family agrees with progress made and goals achieved: Yes  OT Discharge Precautions/Restrictions  Precautions Precautions: Posterior Hip Precaution Booklet Issued: Yes (comment) Precaution Comments: pt able to recall 3/3 precautions Required Braces or Orthoses: Other Brace/Splint Other Brace/Splint: CAM boot on R Restrictions Weight Bearing  Restrictions: Yes RLE Weight Bearing: Weight bearing as tolerated LLE Weight Bearing: Touchdown weight bearing  Pain Pain Assessment Pain Assessment: Faces Faces Pain Scale: Hurts a little bit Pain Type: Acute pain Pain Location: Leg Pain Orientation: Left Pain Descriptors / Indicators: Discomfort Pain Onset: Gradual Pain Intervention(s): Repositioned Multiple Pain Sites: No ADL  See Function Section of chart for details  Vision Baseline Vision/History: Wears glasses Wears Glasses: At all times Patient Visual Report: No change from baseline Perception  Perception: Within Functional Limits Praxis Praxis: Intact Cognition Overall Cognitive Status: Within Functional Limits for tasks assessed Arousal/Alertness: Awake/alert Orientation Level: Oriented X4 Attention: Sustained;Selective Focused Attention: Appears intact Sustained Attention: Appears intact Selective Attention: Appears intact Awareness: Appears intact Problem Solving: Appears intact Safety/Judgment: Appears intact Sensation Sensation Light Touch: Appears Intact Stereognosis: Appears Intact Hot/Cold: Appears Intact Proprioception: Appears Intact Additional Comments: Sensation intact in BUEs.  Numbness in left outer hip and foot. Coordination Gross Motor Movements are Fluid and Coordinated: Yes Fine Motor Movements are Fluid and Coordinated: Yes Coordination and Movement Description: BUE coordination and use WFLS Motor  Motor Motor: Within Functional Limits Motor - Discharge Observations: generalized weakness Mobility    See Function Section of chart for details  Trunk/Postural Assessment  Cervical Assessment Cervical Assessment: Within Functional Limits Thoracic Assessment Thoracic Assessment: Within Functional Limits Lumbar Assessment Lumbar Assessment: Within Functional Limits Postural Control Postural Control: Within Functional Limits  Balance Balance Balance Assessed: Yes Static Sitting  Balance Static Sitting - Balance Support: Feet supported Static Sitting - Level of Assistance: 7: Independent Dynamic Sitting Balance Dynamic Sitting - Balance Support: No upper extremity supported;During functional  activity Dynamic Sitting - Level of Assistance: 6: Modified independent (Device/Increase time) Static Standing Balance Static Standing - Balance Support: During functional activity Static Standing - Level of Assistance: 6: Modified independent (Device/Increase time) Dynamic Standing Balance Dynamic Standing - Balance Support: During functional activity Dynamic Standing - Level of Assistance: 6: Modified independent (Device/Increase time) Extremity/Trunk Assessment RUE Assessment RUE Assessment: Within Functional Limits LUE Assessment LUE Assessment: Within Functional Limits   See Function Navigator for Current Functional Status.  Rubee Vega OTR/L 01/09/2018, 9:40 AM

## 2018-01-09 NOTE — Plan of Care (Signed)
Pt progressing toward goals.

## 2018-01-09 NOTE — Discharge Summary (Signed)
Discharge summary job [id number]

## 2018-01-10 DIAGNOSIS — T148XXA Other injury of unspecified body region, initial encounter: Secondary | ICD-10-CM

## 2018-01-10 MED ORDER — METHOCARBAMOL 500 MG PO TABS: 500.0000 mg | ORAL_TABLET | Freq: Four times a day (QID) | ORAL | 0 refills | Status: DC | PRN

## 2018-01-10 MED ORDER — LIDOCAINE 5 % EX PTCH: 1.0000 | MEDICATED_PATCH | CUTANEOUS | 0 refills | Status: DC

## 2018-01-10 MED ORDER — HYDROCHLOROTHIAZIDE 12.5 MG PO TABS: 12.5000 mg | ORAL_TABLET | Freq: Every day | ORAL | 0 refills | Status: DC

## 2018-01-10 MED ORDER — RIVAROXABAN 15 MG PO TABS
15.0000 mg | ORAL_TABLET | Freq: Two times a day (BID) | ORAL | 0 refills | Status: DC
Start: 2018-01-10 — End: 2020-05-14

## 2018-01-10 MED ORDER — CITALOPRAM HYDROBROMIDE 10 MG PO TABS
10.0000 mg | ORAL_TABLET | Freq: Every day | ORAL | 1 refills | Status: DC
Start: 2018-01-10 — End: 2020-09-21

## 2018-01-10 MED ORDER — RIVAROXABAN 20 MG PO TABS
ORAL_TABLET | ORAL | 1 refills | Status: DC
Start: 2018-01-10 — End: 2020-05-14

## 2018-01-10 MED ORDER — RIVAROXABAN 20 MG PO TABS
20.0000 mg | ORAL_TABLET | Freq: Every day | ORAL | 1 refills | Status: DC
Start: 2018-01-24 — End: 2018-01-10

## 2018-01-10 MED ORDER — OXYCODONE HCL 5 MG PO TABS: 5.0000 mg | ORAL_TABLET | ORAL | 0 refills | Status: DC | PRN

## 2018-01-10 MED ORDER — POLYETHYLENE GLYCOL 3350 17 G PO PACK: 17.0000 g | PACK | Freq: Every day | ORAL | 0 refills | Status: DC

## 2018-01-10 NOTE — Progress Notes (Signed)
Patient discharged to home. Sutures removed from right hip and right knee prior to discharge. Discharge instructions provided by Silvestre Mesi, PA with verbal understanding. Belongings are with patient.

## 2018-01-10 NOTE — Progress Notes (Signed)
[facility]     PROGRESS NOTE  Subjective/Complaints:  Pt seen lying in bed this AM.  She slept well overnight.  She is excited about going home today.   ROS: Denies CP, SOB, nausea, vomiting, diarrhea.  Objective: Vital Signs: Blood pressure (!) 113/58, pulse 62, temperature 97.8 F (36.6 C), temperature source Oral, resp. rate 17, height 5\' 6"  (1.676 m), weight 64 kg (141 lb 1.5 oz), last menstrual period 12/14/2017, SpO2 100 %. Dg Hip Unilat With Pelvis 2-3 Views Left  Result Date: 01/09/2018 CLINICAL DATA:  Fracture EXAM: DG HIP (WITH OR WITHOUT PELVIS) 2-3V LEFT COMPARISON:  12/27/2017, 12/26/2016 FINDINGS: Four screws are present at the LEFT femoral head post ORIF. Hip joint spaces preserved. SI joints symmetric. No additional fracture, dislocation, or bone destruction. IMPRESSION: Post ORIF of the previously seen LEFT femoral head fracture. No acute abnormalities. Electronically Signed   By: Lavonia Dana M.D.   On: 01/09/2018 19:24   No results for input(s): WBC, HGB, HCT, PLT in the last 72 hours. Recent Labs    01/08/18 0419  CREATININE 0.74   CBG (last 3)  No results for input(s): GLUCAP in the last 72 hours.  Wt Readings from Last 3 Encounters:  01/01/18 64 kg (141 lb 1.5 oz)  12/27/17 63.5 kg (140 lb)  07/27/17 65.8 kg (145 lb)    Physical Exam:  BP (!) 113/58 (BP Location: Right Arm)   Pulse 62   Temp 97.8 F (36.6 C) (Oral)   Resp 17   Ht 5\' 6"  (1.676 m)   Wt 64 kg (141 lb 1.5 oz)   LMP 12/14/2017 (Exact Date)   SpO2 100%   BMI 22.77 kg/m  Gen: NAD. Well-developed.  HENT: Normocephalic. Atraumatic.  Eyes:EOMare normal. No discharge.  Cardiovascular:RRR. No JVD. Respiratory:Effort normal and breath sounds normal.  GG:EZMOQ sounds are normal. She exhibitsno distension. Nontender to palpation Musculoskeletal: +TTP left hip, improving. Right ankle with edema, improving Stable no external rotation or  shortening Neurological: She isalertand oriented. Right upper extremity: 4/5 proximal to distal.  Left upper extremity: 5/5 proximal to distal.  Right lower extremity: 4+/5 hip flexion and knee extension, 4/5 ankle dorsiflexion/plantarflexion (some pain inhibition) Left lower extremity: 4-/5 hip flexion, knee extension, 4-/5 ankle dorsiflexion/plantarflexion (some pain inhibition).  Skin. Incision c/d/i.  Assessment/Plan: 1. Functional deficits secondary to polytrauma with left hip fracture which require 3+ hours per day of interdisciplinary therapy in a comprehensive inpatient rehab setting. Physiatrist is providing close team supervision and 24 hour management of active medical problems listed below. Physiatrist and rehab team continue to assess barriers to discharge/monitor patient progress toward functional and medical goals.  Function:  Bathing Bathing position   Position: Shower  Bathing parts Body parts bathed by patient: Right arm, Left arm, Chest, Abdomen, Front perineal area, Right upper leg, Left upper leg, Right lower leg, Back, Buttocks, Left lower leg Body parts bathed by helper: Buttocks  Bathing assist Assist Level: Supervision or verbal cues      Upper Body Dressing/Undressing Upper body dressing   What is the patient wearing?: Pull over shirt/dress, Bra Bra - Perfomed by patient: Thread/unthread right bra strap, Thread/unthread left bra strap, Hook/unhook bra (pull down sports bra)   Pull over shirt/dress - Perfomed by patient: Thread/unthread right sleeve, Thread/unthread left sleeve, Put head through opening, Pull shirt over trunk          Upper body assist Assist Level: More than reasonable time  Lower Body Dressing/Undressing Lower body dressing   What is the patient wearing?: Pants, Non-skid slipper socks, AFO     Pants- Performed by patient: Thread/unthread right pants leg, Thread/unthread left pants leg, Pull pants up/down Pants- Performed by  helper: Pull pants up/down Non-skid slipper socks- Performed by patient: Don/doff right sock, Don/doff left sock Non-skid slipper socks- Performed by helper: Don/doff right sock, Don/doff left sock         AFO - Performed by patient: Don/doff right AFO(right cam boot)        Lower body assist Assist for lower body dressing: Supervision or verbal cues      Toileting Toileting   Toileting steps completed by patient: Adjust clothing prior to toileting, Performs perineal hygiene, Adjust clothing after toileting      Toileting assist Assist level: More than reasonable time   Transfers Chair/bed transfer   Chair/bed transfer method: Squat pivot, Stand pivot, Ambulatory Chair/bed transfer assist level: No Help, no cues, assistive device, takes more than a reasonable amount of time Chair/bed transfer assistive device: Armrests, Medical sales representative     Max distance: 50 ft Assist level: Supervision or verbal cues   Wheelchair   Type: Manual Max wheelchair distance: 150 ft Assist Level: No help, No cues, assistive device, takes more than reasonable amount of time  Cognition Comprehension Comprehension assist level: Follows complex conversation/direction with no assist  Expression Expression assist level: Expresses complex ideas: With no assist  Social Interaction Social Interaction assist level: Interacts appropriately with others with medication or extra time (anti-anxiety, antidepressant).  Problem Solving Problem solving assist level: Solves complex problems: Recognizes & self-corrects  Memory Memory assist level: Complete Independence: No helper     Medical Problem List and Plan: 1.Decreased functional mobilitysecondary to left Pipkin II femoral head fracture dislocation and left knee wound. Status post I&D of left knee, open treatment of hip dislocation, ORIF femoral head fracture 12/27/2017. Touchdown weightbearing with posterior hip precautions   D/c  today  Will see patient for transitional care management in 1-2 weeks   Hip xray showing stable s/p ORIF 2. DVT Prophylaxis/Anticoagulation: Monitor for any bleeding episodes.    Vascular study with left peroneal DVT, continue Xarelto 3. Pain Management:Robaxin and oxycodone as needed   Lidoderm patch added to left hip 4. Mood:Celexa 10 mg daily. Provide emotional support 5. Neuropsych: This patientiscapable of making decisions on herown behalf. 6. Skin/Wound Care:Routine skin checks 7. Fluids/Electrolytes/Nutrition:Routine I&O's    BMP within acceptable range on 2/4, creatinine within normal limits on 2/6   Encouraged healthy diet 8.Acute blood loss anemia.    Hemoglobin 11.1 on 2/4   Continue to monitor 9.History of hypertension.HCTZ 12.5 mg daily    Vitals:   01/09/18 1445 01/10/18 0608  BP: 118/69 (!) 113/58  Pulse: 72 62  Resp: 18 17  Temp: 98.1 F (36.7 C) 97.8 F (36.6 C)  SpO2: 99% 100%    Controlled 2/7 10.Tobacco abuse. Counseling 11.Constipation. Laxative assistance   Bowel meds increased on 1/31  12. Hypoalbuminemia   Supplement initiated on 1/30 13. Right ankle sprain, peroneal tendons    Encouraged icing   Continue boot per Ortho  LOS (Days) 9 A FACE TO FACE EVALUATION WAS PERFORMED  Ankit Lorie Phenix 01/10/2018 8:23 AM

## 2018-01-10 NOTE — Progress Notes (Signed)
Orthopaedic Trauma Progress Note  S: Doing well, making good progress. No complaints. Ready to go home  O:  Vitals:   01/09/18 1445 01/10/18 0608  BP: 118/69 (!) 113/58  Pulse: 72 62  Resp: 18 17  Temp: 98.1 F (36.7 C) 97.8 F (36.6 C)  SpO2: 99% 100%   LLE: incisions clean, dry and intact.   Compartments are soft and compressible.  Neurovascularly intact distally with intact motor and sensory function to all nerve distributions.  Warm well-perfused foot.  States that she has continued numbness along LFCN   A/P: 42 year old female status post ORIF of Pipkin 2 fracture dislocation and left knee traumatic arthrotomy  -Touchdown weightbearing left lower extremity -Posterior hip precautions -PT/OT -Boot for RLE, appears she may have some traumatic injury to her peroneal tendons that will be treated nonoperatively and should improve with immobilization and time. -Xarelto for her peroneal DVT -Remove sutures and return to see me in 4 weeks   Shona Needles, MD [facility] [phone] (phone)

## 2018-01-10 NOTE — Progress Notes (Signed)
Social Work  Discharge Note  The overall goal for the admission was met for:   Discharge location: Yes-HOME WITH PARENT'S UNTIL CAN RETURN TO HER HOME  Length of Stay: Yes-9 DAYS  Discharge activity level: Yes-SUPERVISION-MOD/I LEVEL  Home/community participation: Yes  Services provided included: MD, RD, PT, OT, RN, CM, TR, Pharmacy, Neuropsych and SW  Financial Services: Other: MED-PAY & PENDING MEDICAID  Follow-up services arranged: Home Health: [place], DME: [facility] and Patient/Family has no preference for HH/DME agencies  Comments (or additional information):PT DID VERY WELL AND PLANS TO GO TO PARENTS HOME UNTIL ABLE TO RETURN TO HER OWN. PLANS TO RETRO-MEDICAID APPLICATION-WORKING WITH FINANCIAL COUNSELOR WITH THIS. GAVE 30 DAY XARELTO CARD  Patient/Family verbalized understanding of follow-up arrangements: Yes  Individual responsible for coordination of the follow-up plan: SELF & JILL-MOM  Confirmed correct DME delivered: Lauren Matthews 01/10/2018    Lauren Matthews

## 2018-01-11 ENCOUNTER — Telehealth: Payer: Self-pay | Admitting: Registered Nurse

## 2018-01-11 NOTE — Telephone Encounter (Signed)
Transitional Care call  Transitional Care Call Completed, Appointment Confirmed, Address Confirmed, New Patient Packet Mailed  Patient name: Lauren Matthews DOB: 1976-03-26 1. Are you/is patient experiencing any problems since coming home? No a. Are there any questions regarding any aspect of care? No 2. Are there any questions regarding medications administration/dosing? No a. Are meds being taken as prescribed? Yes b. "Patient should review meds with caller to confirm" Medication List reviewed, Instructed to call office if her Oxycodone is running low, she verbalizes understanding.  3. Have there been any falls? No 4. Has Home Health been to the house and/or have they contacted you? Ms. Aleda Madl spoke with [facility], they will call her on Sunday with appointment time for Monday 01/14/2018 she reports.  a. If not, have you tried to contact them? Na b. Can we help you contact them? Na 5. Are bowels and bladder emptying properly? Yes a. Are there any unexpected incontinence issues? No b. If applicable, is patient following bowel/bladder programs? Na 6. Any fevers, problems with breathing, unexpected pain? No 7. Are there any skin problems or new areas of breakdown? No 8. Has the patient/family member arranged specialty MD follow up (ie cardiology/neurology/renal/surgical/etc.)?  Ms. Gayleen Sholtz in the process of calling them today, 01/11/2018. a. Can we help arrange? No 9. Does the patient need any other services or support that we can help arrange? No 10. Are caregivers following through as expected in assisting the patient? Yes 11. Has the patient quit smoking, drinking alcohol, or using drugs as recommended? (Patient denies smoking, drinking alcohol or use of illicit drugs.   Appointment date/time 01/24/2018, arrival time 10:40 for 11:00 appointment with Dr. Posey Pronto at  [phone]

## 2018-01-14 ENCOUNTER — Telehealth: Payer: Self-pay | Admitting: Physical Medicine & Rehabilitation

## 2018-01-14 NOTE — Telephone Encounter (Signed)
PATIENT CALLED OFFICE AND STATE ONLY HS 5 LEFT OF HER OXY - NEEDS REFILL AND WAS ADVISED IF ALMOST OUT.

## 2018-01-14 NOTE — Telephone Encounter (Signed)
She needs to try to wean the medication.  She can have 10 tablets if she needs a refill before her appointment on Friday, otherwise, we will discuss it at that time. Thanks.

## 2018-01-15 ENCOUNTER — Telehealth: Payer: Self-pay | Admitting: Physical Medicine & Rehabilitation

## 2018-01-15 ENCOUNTER — Other Ambulatory Visit: Payer: Self-pay | Admitting: Physical Medicine & Rehabilitation

## 2018-01-15 MED ORDER — OXYCODONE HCL 5 MG PO TABS: 5.0000 mg | ORAL_TABLET | Freq: Three times a day (TID) | ORAL | 0 refills | Status: DC | PRN

## 2018-01-15 NOTE — Telephone Encounter (Signed)
PATIENT CALLED AGAIN AND NEEDS MEDIOCINE - AP HAS ANSWERED - THIS IS HER THIRD PHONE CALL  [phone]

## 2018-01-15 NOTE — Telephone Encounter (Signed)
I was in contact with Dr. Posey Pronto, I wasn't aware of Dr. Posey Pronto response. Dr. Posey Pronto stated the oxycodone this provider prescribe was fine.

## 2018-01-15 NOTE — Telephone Encounter (Signed)
PATIENT CALLED AGAIN 10:37 AM 02.12.19  REQUESTING MORE rx  - SEE AP'S MESSAGE SHE NEEDS TO WEAN DOWN - HER VISIT IS Friday - PLEASE CALL AND GIVE HER THIS INFORMATION

## 2018-01-15 NOTE — Telephone Encounter (Signed)
Received a call from Ms. Bertis Ruddy, she asked for a refill on her Oxycodone. She is averaging taking one tablet prior to her physical therapy ( twice a day) and at night. She is scheduled to see Dr. Posey Pronto on 01/18/2018. According to PMP Aware Web Site Oxycodone was picked up  on 01/10/2018, only 20 tablets was dispensed. Oxycodone ordered today, she verbalizes understanding. We discussed using [facility] due to no insurance coverage, she verbalizes understanding.

## 2018-01-15 NOTE — Telephone Encounter (Signed)
Are you going to escribe it?

## 2018-01-15 NOTE — Telephone Encounter (Signed)
This has been addressed by Zella Ball.

## 2018-01-18 ENCOUNTER — Other Ambulatory Visit: Payer: Self-pay

## 2018-01-18 ENCOUNTER — Encounter: Payer: Self-pay | Admitting: Physical Medicine & Rehabilitation

## 2018-01-18 ENCOUNTER — Encounter: Payer: Self-pay | Attending: Physical Medicine & Rehabilitation | Admitting: Physical Medicine & Rehabilitation

## 2018-01-18 VITALS — BP 124/85 | HR 69

## 2018-01-18 DIAGNOSIS — X58XXXA Exposure to other specified factors, initial encounter: Secondary | ICD-10-CM | POA: Insufficient documentation

## 2018-01-18 DIAGNOSIS — J42 Unspecified chronic bronchitis: Secondary | ICD-10-CM | POA: Insufficient documentation

## 2018-01-18 DIAGNOSIS — F411 Generalized anxiety disorder: Secondary | ICD-10-CM

## 2018-01-18 DIAGNOSIS — Z87442 Personal history of urinary calculi: Secondary | ICD-10-CM | POA: Insufficient documentation

## 2018-01-18 DIAGNOSIS — Z7901 Long term (current) use of anticoagulants: Secondary | ICD-10-CM | POA: Insufficient documentation

## 2018-01-18 DIAGNOSIS — S93401A Sprain of unspecified ligament of right ankle, initial encounter: Secondary | ICD-10-CM | POA: Insufficient documentation

## 2018-01-18 DIAGNOSIS — I82492 Acute embolism and thrombosis of other specified deep vein of left lower extremity: Secondary | ICD-10-CM | POA: Insufficient documentation

## 2018-01-18 DIAGNOSIS — T1490XA Injury, unspecified, initial encounter: Secondary | ICD-10-CM

## 2018-01-18 DIAGNOSIS — F419 Anxiety disorder, unspecified: Secondary | ICD-10-CM | POA: Insufficient documentation

## 2018-01-18 DIAGNOSIS — F329 Major depressive disorder, single episode, unspecified: Secondary | ICD-10-CM | POA: Insufficient documentation

## 2018-01-18 DIAGNOSIS — I82452 Acute embolism and thrombosis of left peroneal vein: Secondary | ICD-10-CM

## 2018-01-18 DIAGNOSIS — Z9889 Other specified postprocedural states: Secondary | ICD-10-CM | POA: Insufficient documentation

## 2018-01-18 DIAGNOSIS — Z87891 Personal history of nicotine dependence: Secondary | ICD-10-CM | POA: Insufficient documentation

## 2018-01-18 DIAGNOSIS — S93401S Sprain of unspecified ligament of right ankle, sequela: Secondary | ICD-10-CM

## 2018-01-18 DIAGNOSIS — G8918 Other acute postprocedural pain: Secondary | ICD-10-CM

## 2018-01-18 DIAGNOSIS — I10 Essential (primary) hypertension: Secondary | ICD-10-CM

## 2018-01-18 MED ORDER — GABAPENTIN 300 MG PO CAPS: 300.0000 mg | ORAL_CAPSULE | Freq: Every day | ORAL | 1 refills | Status: DC

## 2018-01-18 NOTE — Addendum Note (Signed)
Addended by: Caro Hight on: 01/18/2018 02:52 PM   Modules accepted: Orders
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
## 2018-01-22 ENCOUNTER — Ambulatory Visit
Admission: RE | Admit: 2018-01-22 | Discharge: 2018-01-22 | Disposition: A | Discharge status: Home / Self Care | Payer: No Typology Code available for payment source | Source: Ambulatory Visit | Attending: Student | Admitting: Student

## 2018-01-22 ENCOUNTER — Other Ambulatory Visit: Payer: Self-pay | Admitting: Student

## 2018-01-22 DIAGNOSIS — M7989 Other specified soft tissue disorders: Principal | ICD-10-CM

## 2018-01-22 DIAGNOSIS — M79605 Pain in left leg: Secondary | ICD-10-CM

## 2018-03-01 ENCOUNTER — Encounter: Payer: Self-pay | Attending: Physical Medicine & Rehabilitation | Admitting: Physical Medicine & Rehabilitation

## 2018-03-01 DIAGNOSIS — I82492 Acute embolism and thrombosis of other specified deep vein of left lower extremity: Secondary | ICD-10-CM | POA: Insufficient documentation

## 2018-03-01 DIAGNOSIS — I10 Essential (primary) hypertension: Secondary | ICD-10-CM | POA: Insufficient documentation

## 2018-03-01 DIAGNOSIS — F329 Major depressive disorder, single episode, unspecified: Secondary | ICD-10-CM | POA: Insufficient documentation

## 2018-03-01 DIAGNOSIS — J42 Unspecified chronic bronchitis: Secondary | ICD-10-CM | POA: Insufficient documentation

## 2018-03-01 DIAGNOSIS — S93401A Sprain of unspecified ligament of right ankle, initial encounter: Secondary | ICD-10-CM | POA: Insufficient documentation

## 2018-03-01 DIAGNOSIS — Z87891 Personal history of nicotine dependence: Secondary | ICD-10-CM | POA: Insufficient documentation

## 2018-03-01 DIAGNOSIS — Z9889 Other specified postprocedural states: Secondary | ICD-10-CM | POA: Insufficient documentation

## 2018-03-01 DIAGNOSIS — Z87442 Personal history of urinary calculi: Secondary | ICD-10-CM | POA: Insufficient documentation

## 2018-03-01 DIAGNOSIS — Z7901 Long term (current) use of anticoagulants: Secondary | ICD-10-CM | POA: Insufficient documentation

## 2018-03-01 DIAGNOSIS — F419 Anxiety disorder, unspecified: Secondary | ICD-10-CM | POA: Insufficient documentation

## 2018-03-20 ENCOUNTER — Other Ambulatory Visit: Payer: Self-pay | Admitting: Physical Medicine & Rehabilitation

## 2018-09-17 IMAGING — US US EXTREM LOW VENOUS*L*
1 series · 13 of 24 positions shown · non-contrast
Comparison: None.

CLINICAL DATA: Follow-up left lower extremity deep venous
thrombosis



[Series 1: us extrem low venous*left* · 0.06mm/px · 39 acquisitions, 13 frames shown]
[im 1/39]
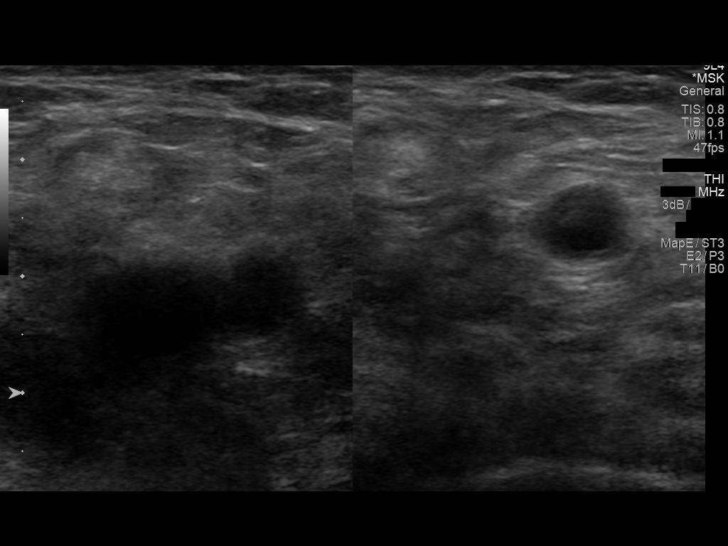
[im 4/39]
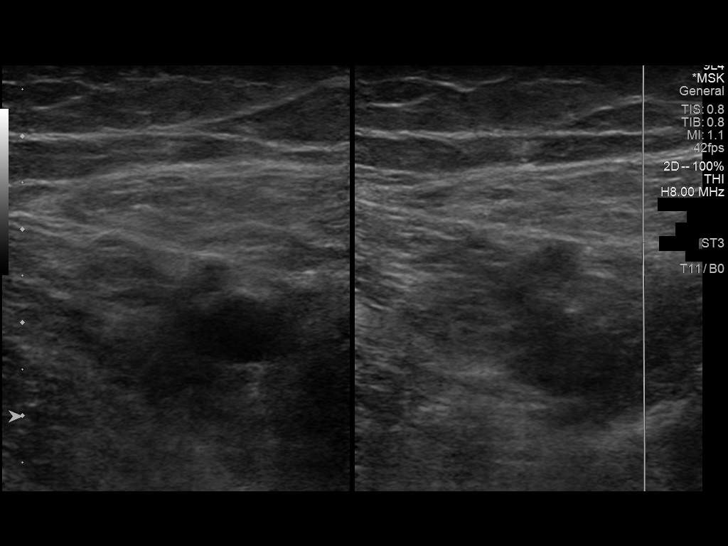
[im 7/39]
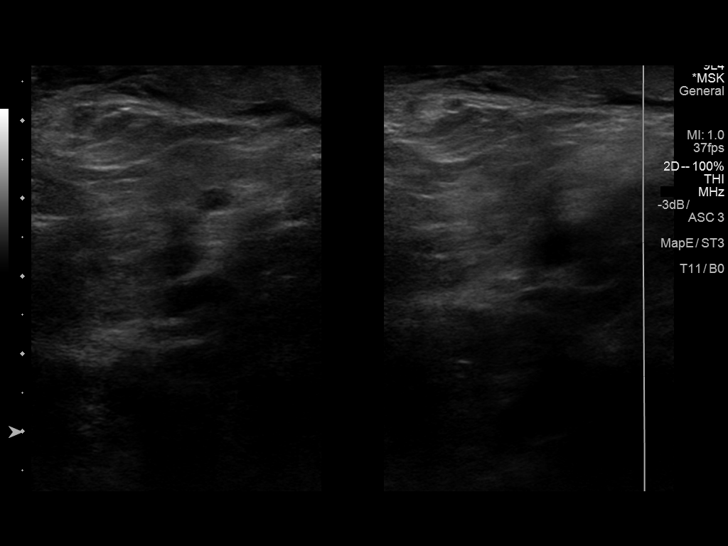
[im 12/39]
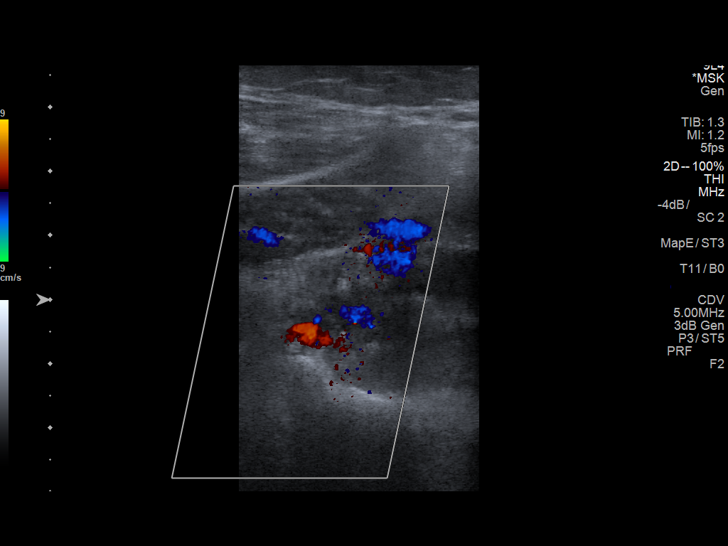
[im 15/39]
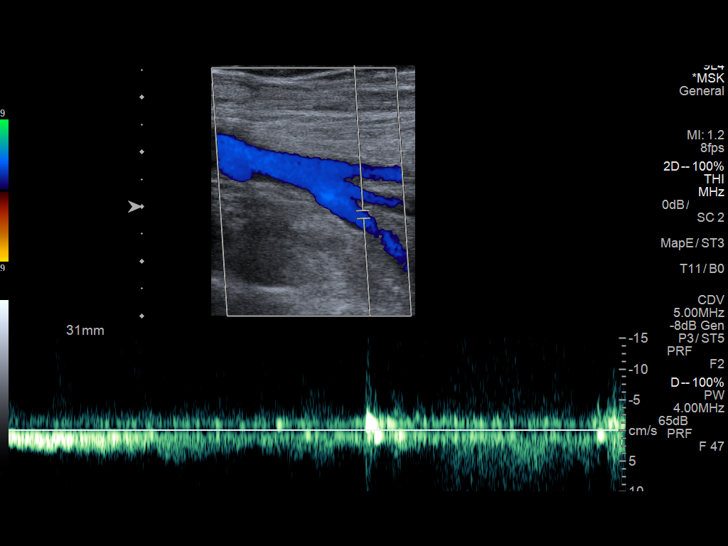
[im 19/39]
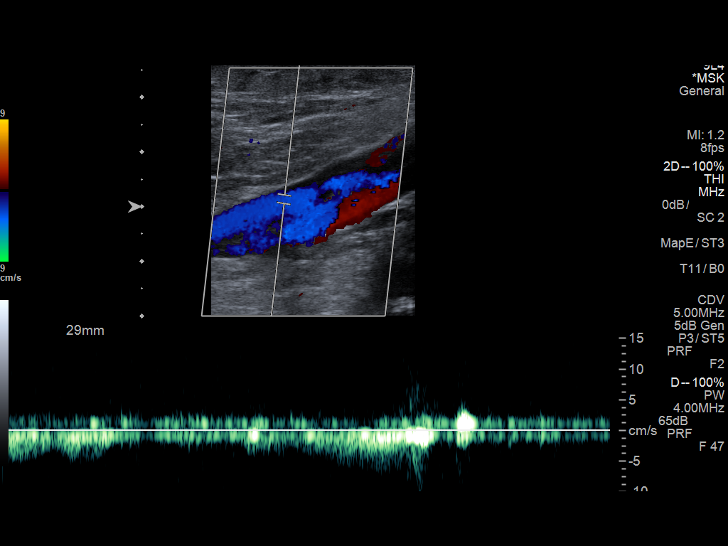
[im 22/39]
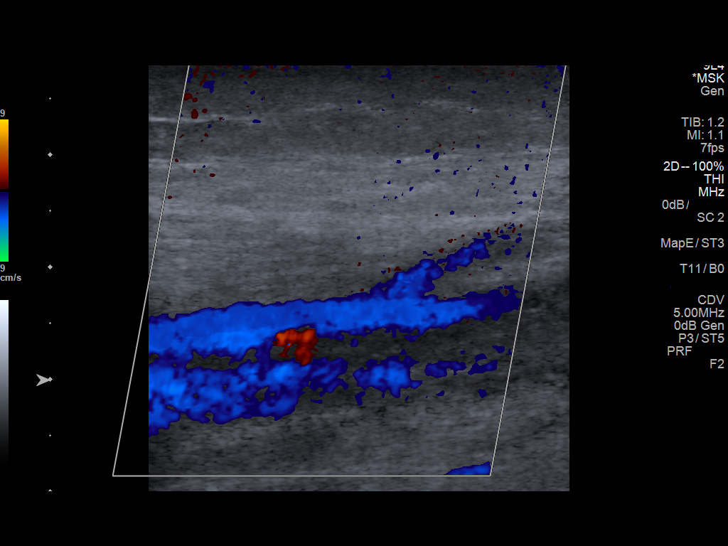
[im 24/39]
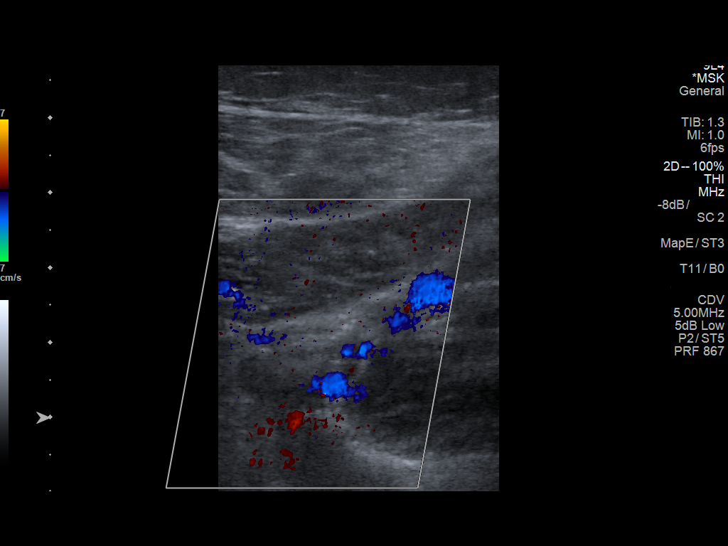
[im 27/39]
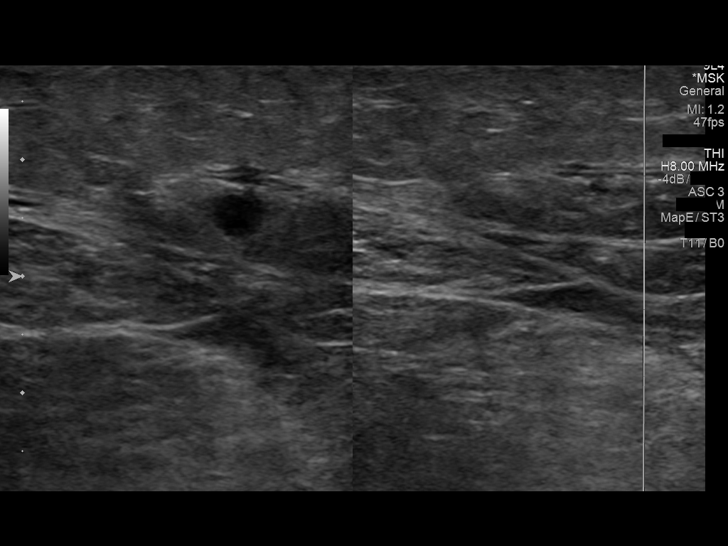
[im 30/39]
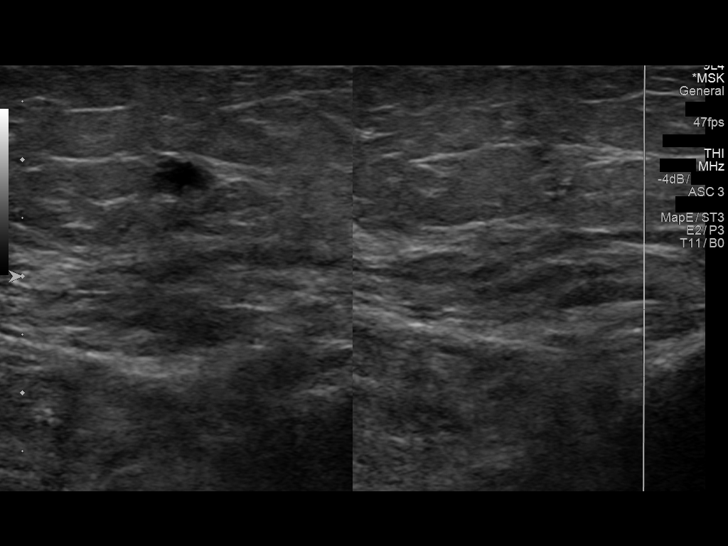
[im 34/39]
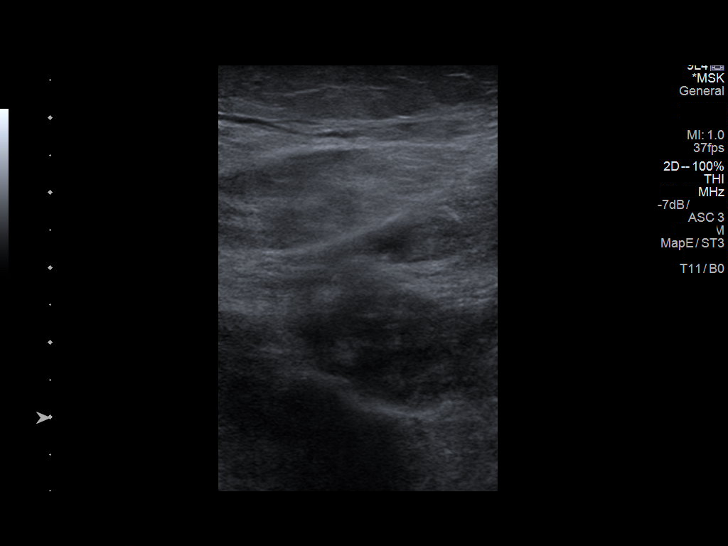
[im 35/39]
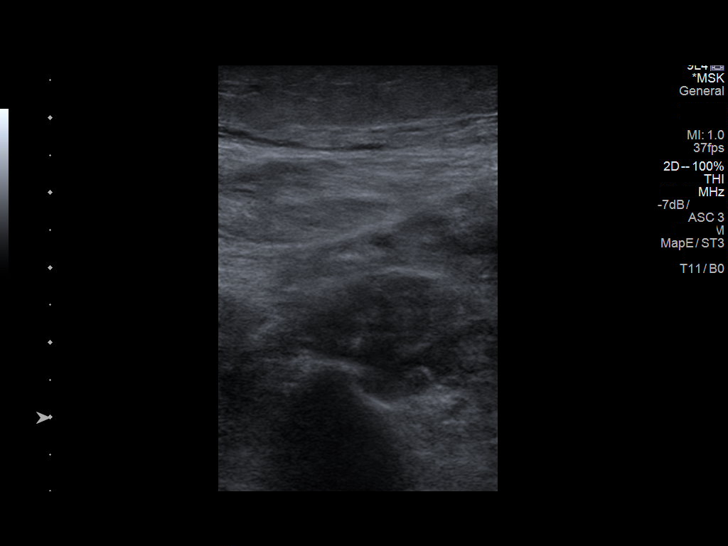
[im 39/39]
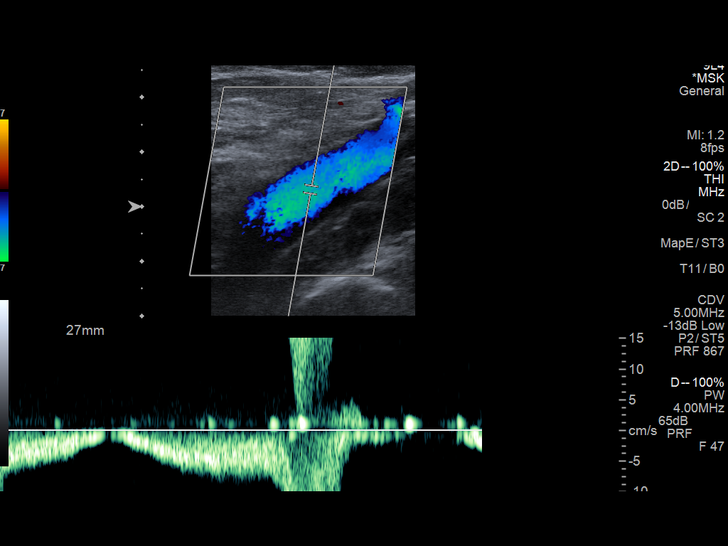

[13 of 24 positions shown; findings below may reference images not displayed]

FINDINGS: Contralateral Common Femoral Vein: Respiratory phasicity is normal
and symmetric with the symptomatic side. No evidence of thrombus.
Normal compressibility.

Common Femoral Vein: No evidence of thrombus. Normal
compressibility, respiratory phasicity and response to augmentation.

Saphenofemoral Junction: No evidence of thrombus. Normal
compressibility and flow on color Doppler imaging.

Profunda Femoral Vein: No evidence of thrombus. Normal
compressibility and flow on color Doppler imaging.

Femoral Vein: No evidence of thrombus. Normal compressibility,
respiratory phasicity and response to augmentation.

Popliteal Vein: No evidence of thrombus. Normal compressibility,
respiratory phasicity and response to augmentation.

Calf Veins: No evidence of thrombus. Normal compressibility and flow
on color Doppler imaging.

Superficial Great Saphenous Vein: No evidence of thrombus. Normal
compressibility.

Venous Reflux:  None.

Other Findings:  None.
IMPRESSION: No evidence of deep venous thrombosis.

## 2019-01-14 ENCOUNTER — Other Ambulatory Visit: Payer: Self-pay | Admitting: Internal Medicine

## 2019-01-14 DIAGNOSIS — Z1231 Encounter for screening mammogram for malignant neoplasm of breast: Secondary | ICD-10-CM

## 2019-01-24 ENCOUNTER — Ambulatory Visit
Admission: RE | Admit: 2019-01-24 | Discharge: 2019-01-24 | Disposition: A | Discharge status: Home / Self Care | Payer: No Typology Code available for payment source | Source: Ambulatory Visit | Attending: Internal Medicine | Admitting: Internal Medicine

## 2019-01-24 ENCOUNTER — Other Ambulatory Visit: Payer: Self-pay | Admitting: Internal Medicine

## 2019-01-24 DIAGNOSIS — J40 Bronchitis, not specified as acute or chronic: Secondary | ICD-10-CM

## 2019-02-18 ENCOUNTER — Other Ambulatory Visit: Payer: Self-pay

## 2019-02-18 ENCOUNTER — Ambulatory Visit
Admission: RE | Admit: 2019-02-18 | Discharge: 2019-02-18 | Disposition: A | Discharge status: Home / Self Care | Payer: No Typology Code available for payment source | Source: Ambulatory Visit | Attending: Internal Medicine | Admitting: Internal Medicine

## 2019-02-18 DIAGNOSIS — Z1231 Encounter for screening mammogram for malignant neoplasm of breast: Secondary | ICD-10-CM

## 2020-02-20 ENCOUNTER — Encounter: Payer: Self-pay | Admitting: Certified Nurse Midwife

## 2020-05-14 ENCOUNTER — Ambulatory Visit (INDEPENDENT_AMBULATORY_CARE_PROVIDER_SITE_OTHER): Payer: 59 | Admitting: Family Medicine

## 2020-05-14 ENCOUNTER — Other Ambulatory Visit: Payer: Self-pay

## 2020-05-14 ENCOUNTER — Encounter: Payer: Self-pay | Admitting: Family Medicine

## 2020-05-14 VITALS — BP 122/90 | HR 57 | Temp 97.3°F | Resp 18 | Ht 66.0 in | Wt 163.0 lb

## 2020-05-14 DIAGNOSIS — K589 Irritable bowel syndrome without diarrhea: Secondary | ICD-10-CM

## 2020-05-14 DIAGNOSIS — D6852 Prothrombin gene mutation: Secondary | ICD-10-CM | POA: Diagnosis not present

## 2020-05-14 DIAGNOSIS — Z8249 Family history of ischemic heart disease and other diseases of the circulatory system: Secondary | ICD-10-CM

## 2020-05-14 DIAGNOSIS — I10 Essential (primary) hypertension: Secondary | ICD-10-CM | POA: Diagnosis not present

## 2020-05-14 DIAGNOSIS — Z86718 Personal history of other venous thrombosis and embolism: Secondary | ICD-10-CM | POA: Diagnosis not present

## 2020-05-14 DIAGNOSIS — F339 Major depressive disorder, recurrent, unspecified: Secondary | ICD-10-CM

## 2020-05-14 DIAGNOSIS — F5101 Primary insomnia: Secondary | ICD-10-CM

## 2020-05-14 DIAGNOSIS — F988 Other specified behavioral and emotional disorders with onset usually occurring in childhood and adolescence: Secondary | ICD-10-CM

## 2020-05-14 NOTE — Patient Instructions (Signed)
Please return in October 2021 for your annual complete physical; please come fasting.  It was a pleasure meeting you today! Thank you for choosing [place] to meet your healthcare needs! I truly look forward to working with you. If you have any questions or concerns, please send me a message via Mychart or call the office at [phone].
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
## 2020-05-17 ENCOUNTER — Encounter: Payer: Self-pay | Admitting: Family Medicine

## 2020-07-04 HISTORY — PX: ROOT CANAL: SHX2363

## 2020-09-21 ENCOUNTER — Other Ambulatory Visit: Payer: Self-pay

## 2020-09-21 ENCOUNTER — Ambulatory Visit (INDEPENDENT_AMBULATORY_CARE_PROVIDER_SITE_OTHER): Payer: 59 | Admitting: Family Medicine

## 2020-09-21 ENCOUNTER — Encounter: Payer: Self-pay | Admitting: Family Medicine

## 2020-09-21 VITALS — BP 164/110 | HR 54 | Temp 98.2°F | Resp 16 | Ht 65.0 in | Wt 149.6 lb

## 2020-09-21 DIAGNOSIS — D6852 Prothrombin gene mutation: Secondary | ICD-10-CM

## 2020-09-21 DIAGNOSIS — Z86718 Personal history of other venous thrombosis and embolism: Secondary | ICD-10-CM

## 2020-09-21 DIAGNOSIS — Z0000 Encounter for general adult medical examination without abnormal findings: Secondary | ICD-10-CM | POA: Diagnosis not present

## 2020-09-21 DIAGNOSIS — F339 Major depressive disorder, recurrent, unspecified: Secondary | ICD-10-CM | POA: Diagnosis not present

## 2020-09-21 DIAGNOSIS — I10 Essential (primary) hypertension: Secondary | ICD-10-CM | POA: Diagnosis not present

## 2020-09-21 DIAGNOSIS — Z1231 Encounter for screening mammogram for malignant neoplasm of breast: Secondary | ICD-10-CM | POA: Diagnosis not present

## 2020-09-21 DIAGNOSIS — N6012 Diffuse cystic mastopathy of left breast: Secondary | ICD-10-CM | POA: Diagnosis not present

## 2020-09-21 DIAGNOSIS — F5101 Primary insomnia: Secondary | ICD-10-CM

## 2020-09-21 LAB — CBC WITH DIFFERENTIAL/PLATELET
Absolute Monocytes: 715 cells/uL (ref 200–950)
HCT: 47.4 % — ABNORMAL HIGH (ref 35.0–45.0)
Lymphs Abs: 1825 cells/uL (ref 850–3900)
RDW: 12.3 % (ref 11.0–15.0)

## 2020-09-21 MED ORDER — LISINOPRIL-HYDROCHLOROTHIAZIDE 10-12.5 MG PO TABS: 1.0000 | ORAL_TABLET | Freq: Every day | ORAL | 3 refills | Status: DC

## 2020-09-21 MED ORDER — CITALOPRAM HYDROBROMIDE 10 MG PO TABS: 10.0000 mg | ORAL_TABLET | Freq: Every day | ORAL | 3 refills | Status: DC

## 2020-09-21 NOTE — Patient Instructions (Addendum)
Please return in 3 months for hypertension follow up.  I've changed your medication to a combination blood pressure pill. Please start it once a day.  I have refilled your celexa.   I will release your lab results to you on your MyChart account with further instructions. Please reply with any questions.   If you have any questions or concerns, please don't hesitate to send me a message via MyChart or call the office at [phone]. Thank you for visiting with [place] today! It's our pleasure caring for you.  I have ordered a mammogram and/or bone density for you as we discussed today: [x]   Mammogram  []   Bone Density  Please call the office checked below to schedule your appointment: Your appointment will at the following location  [x]   [facility]      [place], [place]         []   [facility]  [address] [place], [place]
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
## 2020-09-22 LAB — CBC WITH DIFFERENTIAL/PLATELET
Basophils Absolute: 29 cells/uL (ref 0–200)
Basophils Relative: 0.4 %
Eosinophils Absolute: 29 cells/uL (ref 15–500)
Eosinophils Relative: 0.4 %
Hemoglobin: 16.9 g/dL — ABNORMAL HIGH (ref 11.7–15.5)
MCH: 33.7 pg — ABNORMAL HIGH (ref 27.0–33.0)
MCHC: 35.7 g/dL (ref 32.0–36.0)
MCV: 94.6 fL (ref 80.0–100.0)
MPV: 11 fL (ref 7.5–12.5)
Monocytes Relative: 9.8 %
Neutro Abs: 4701 cells/uL (ref 1500–7800)
Neutrophils Relative %: 64.4 %
Platelets: 229 10*3/uL (ref 140–400)
RBC: 5.01 10*6/uL (ref 3.80–5.10)
Total Lymphocyte: 25 %
WBC: 7.3 10*3/uL (ref 3.8–10.8)

## 2020-09-22 LAB — LIPID PANEL
Cholesterol: 177 mg/dL (ref <200)
HDL: 58 mg/dL (ref >=50)
LDL Cholesterol (Calc): 99 mg/dL (calc)
Non-HDL Cholesterol (Calc): 119 mg/dL (calc) (ref <130)
Total CHOL/HDL Ratio: 3.1 (calc) (ref <5.0)
Triglycerides: 108 mg/dL (ref <150)

## 2020-09-22 LAB — COMPLETE METABOLIC PANEL WITH GFR
AG Ratio: 2.4 (calc) (ref 1.0–2.5)
ALT: 14 U/L (ref 6–29)
AST: 17 U/L (ref 10–30)
Albumin: 5.2 g/dL — ABNORMAL HIGH (ref 3.6–5.1)
Alkaline phosphatase (APISO): 58 U/L (ref 31–125)
BUN/Creatinine Ratio: 8 (calc) (ref 6–22)
BUN: 6 mg/dL — ABNORMAL LOW (ref 7–25)
CO2: 29 mmol/L (ref 20–32)
Calcium: 9.9 mg/dL (ref 8.6–10.2)
Chloride: 97 mmol/L — ABNORMAL LOW (ref 98–110)
Creat: 0.78 mg/dL (ref 0.50–1.10)
GFR, Est African American: 109 mL/min/{1.73_m2} (ref >=60)
GFR, Est Non African American: 94 mL/min/{1.73_m2} (ref >=60)
Globulin: 2.2 g/dL (calc) (ref 1.9–3.7)
Glucose, Bld: 86 mg/dL (ref 65–99)
Potassium: 3.5 mmol/L (ref 3.5–5.3)
Sodium: 137 mmol/L (ref 135–146)
Total Bilirubin: 1 mg/dL (ref 0.2–1.2)
Total Protein: 7.4 g/dL (ref 6.1–8.1)

## 2020-09-22 LAB — TSH: TSH: 1.43 mIU/L

## 2020-10-06 ENCOUNTER — Encounter: Payer: Self-pay | Admitting: Family Medicine

## 2020-10-11 ENCOUNTER — Other Ambulatory Visit: Payer: Self-pay

## 2020-10-11 ENCOUNTER — Ambulatory Visit (INDEPENDENT_AMBULATORY_CARE_PROVIDER_SITE_OTHER): Payer: 59 | Admitting: Family Medicine

## 2020-10-11 ENCOUNTER — Encounter: Payer: Self-pay | Admitting: Family Medicine

## 2020-10-11 VITALS — BP 134/86 | HR 51 | Temp 97.6°F | Wt 151.8 lb

## 2020-10-11 DIAGNOSIS — K649 Unspecified hemorrhoids: Secondary | ICD-10-CM

## 2020-10-11 DIAGNOSIS — K6289 Other specified diseases of anus and rectum: Secondary | ICD-10-CM | POA: Diagnosis not present

## 2020-10-11 MED ORDER — HYDROCORTISONE ACETATE 30 MG RE SUPP: RECTAL | 1 refills | Status: DC

## 2020-10-11 NOTE — Progress Notes (Signed)
Subjective  CC:  Chief Complaint  Patient presents with   Hemorrhoids    new job - she is sitting alot more     HPI: Lauren Matthews is a 44 y.o. female who presents to the office today to address the problems listed above in the chief complaint.  Long history of hemorrhoids since age 82; over last week, painful hemorrhoid that persists and hurst to defecate. Has had increase in loose stools (IBS and stress related). Eating more raw veggies. No abdominal pain f/c/s. Prep H not helpful.  No bleeding. Some itching.    Assessment  1. Hemorrhoids, unspecified hemorrhoid type   2. Rectal pain      Plan   hemorrhoid:  ? Thrombosed but not acute; internal hemorrhoid with inflammation noted but no obvious fissure. Treat with steroid supp bid and to surgeon for eval if not improving. Patient understands and agrees with care plan.    Follow up: prn  12/24/2020  No orders of the defined types were placed in this encounter.  Meds ordered this encounter  Medications   HYDROCORTISONE ACE, RECTAL, (PROCTOCORT) 30 MG SUPP    Sig: Use per rectum bid x 5-7 days    Dispense:  12 suppository    Refill:  1      I reviewed the patients updated PMH, FH, and SocHx.    Patient Active Problem List   Diagnosis Date Noted   Major depression, recurrent, chronic ([place]) 05/14/2020    Priority: High   Family history of premature CAD 05/14/2020    Priority: High   Essential hypertension     Priority: High   ADD (attention deficit disorder) 03/11/2013    Priority: High   Irritable bowel syndrome 05/14/2020    Priority: Medium   Primary insomnia 05/14/2020    Priority: Medium   History of DVT (deep vein thrombosis)     Priority: Medium   Heterozygous for prothrombin II gene mutation 03/11/2013    Priority: Medium   Migraine, unspecified, without mention of intractable migraine without mention of status migrainosus 03/11/2013    Priority: Medium   Fibrocystic breast changes,  left 09/21/2020   Current Meds  Medication Sig   citalopram (CELEXA) 10 MG tablet Take 1 tablet (10 mg total) by mouth at bedtime.   lisinopril-hydrochlorothiazide (ZESTORETIC) 10-12.5 MG tablet Take 1 tablet by mouth daily.   traZODone (DESYREL) 50 MG tablet Take 50 mg by mouth at bedtime.    Allergies: Patient has No Known Allergies. Family History: Patient family history includes Bleeding Disorder in her father and sister; Breast cancer in her paternal grandmother; Deep vein thrombosis in her father and sister; Hypertension in her maternal grandmother and mother; Pulmonary embolism in her father; Skin cancer in her father. Social History:  Patient  reports that she quit smoking about 3 years ago. Her smoking use included cigarettes. She has a 2.40 pack-year smoking history. She has never used smokeless tobacco. She reports that she does not drink alcohol and does not use drugs.  Review of Systems: Constitutional: Negative for fever malaise or anorexia Cardiovascular: negative for chest pain Respiratory: negative for SOB or persistent cough Gastrointestinal: negative for abdominal pain  Objective  Vitals: BP 134/86    Pulse (!) 51    Temp 97.6 F (36.4 C) (Temporal)    Wt 151 lb 12.8 oz (68.9 kg)    SpO2 99%    BMI 25.26 kg/m  General: no acute distress , A&Ox3 Gastrointestinal: soft, flat  abdomen, normal active bowel sounds, no palpable masses, no hepatosplenomegaly, no appreciated hernias Rectal: external hemorrhoid tender and present w/o obvious clot; anoscopy: inflamed mucosa w/o fissure. No mass. Tender.     Commons side effects, risks, benefits, and alternatives for medications and treatment plan prescribed today were discussed, and the patient expressed understanding of the given instructions. Patient is instructed to call or message via MyChart if he/she has any questions or concerns regarding our treatment plan. No barriers to understanding were identified. We discussed  Red Flag symptoms and signs in detail. Patient expressed understanding regarding what to do in case of urgent or emergency type symptoms.   Medication list was reconciled, printed and provided to the patient in AVS. Patient instructions and summary information was reviewed with the patient as documented in the AVS. This note was prepared with assistance of Dragon voice recognition software. Occasional wrong-word or sound-a-like substitutions may have occurred due to the inherent limitations of voice recognition software  This visit occurred during the SARS-CoV-2 public health emergency.  Safety protocols were in place, including screening questions prior to the visit, additional usage of staff PPE, and extensive cleaning of exam room while observing appropriate contact time as indicated for disinfecting solutions.

## 2020-10-11 NOTE — Patient Instructions (Addendum)
Please follow up if symptoms do not improve or as needed.  Try the rectal suppositories over the next week with advil to get things calmed down. Let me know if it is not improving.  Thanks.   Hemorrhoids Hemorrhoids are swollen veins in and around the rectum or anus. There are two types of hemorrhoids:  Internal hemorrhoids. These occur in the veins that are just inside the rectum. They may poke through to the outside and become irritated and painful.  External hemorrhoids. These occur in the veins that are outside the anus and can be felt as a painful swelling or hard lump near the anus. Most hemorrhoids do not cause serious problems, and they can be managed with home treatments such as diet and lifestyle changes. If home treatments do not help the symptoms, procedures can be done to shrink or remove the hemorrhoids. What are the causes? This condition is caused by increased pressure in the anal area. This pressure may result from various things, including:  Constipation.  Straining to have a bowel movement.  Diarrhea.  Pregnancy.  Obesity.  Sitting for long periods of time.  Heavy lifting or other activity that causes you to strain.  Anal sex.  Riding a bike for a long period of time. What are the signs or symptoms? Symptoms of this condition include:  Pain.  Anal itching or irritation.  Rectal bleeding.  Leakage of stool (feces).  Anal swelling.  One or more lumps around the anus. How is this diagnosed? This condition can often be diagnosed through a visual exam. Other exams or tests may also be done, such as:  An exam that involves feeling the rectal area with a gloved hand (digital rectal exam).  An exam of the anal canal that is done using a small tube (anoscope).  A blood test, if you have lost a significant amount of blood.  A test to look inside the colon using a flexible tube with a camera on the end (sigmoidoscopy or colonoscopy). How is this  treated? This condition can usually be treated at home. However, various procedures may be done if dietary changes, lifestyle changes, and other home treatments do not help your symptoms. These procedures can help make the hemorrhoids smaller or remove them completely. Some of these procedures involve surgery, and others do not. Common procedures include:  Rubber band ligation. Rubber bands are placed at the base of the hemorrhoids to cut off their blood supply.  Sclerotherapy. Medicine is injected into the hemorrhoids to shrink them.  Infrared coagulation. A type of light energy is used to get rid of the hemorrhoids.  Hemorrhoidectomy surgery. The hemorrhoids are surgically removed, and the veins that supply them are tied off.  Stapled hemorrhoidopexy surgery. The surgeon staples the base of the hemorrhoid to the rectal wall. Follow these instructions at home: Eating and drinking   Eat foods that have a lot of fiber in them, such as whole grains, beans, nuts, fruits, and vegetables.  Ask your health care provider about taking products that have added fiber (fiber supplements).  Reduce the amount of fat in your diet. You can do this by eating low-fat dairy products, eating less red meat, and avoiding processed foods.  Drink enough fluid to keep your urine pale yellow. Managing pain and swelling   Take warm sitz baths for 20 minutes, 3-4 times a day to ease pain and discomfort. You may do this in a bathtub or using a portable sitz bath that fits over  the toilet.  If directed, apply ice to the affected area. Using ice packs between sitz baths may be helpful. ? Put ice in a plastic bag. ? Place a towel between your skin and the bag. ? Leave the ice on for 20 minutes, 2-3 times a day. General instructions  Take over-the-counter and prescription medicines only as told by your health care provider.  Use medicated creams or suppositories as told.  Get regular exercise. Ask your health  care provider how much and what kind of exercise is best for you. In general, you should do moderate exercise for at least 30 minutes on most days of the week (150 minutes each week). This can include activities such as walking, biking, or yoga.  Go to the bathroom when you have the urge to have a bowel movement. Do not wait.  Avoid straining to have bowel movements.  Keep the anal area dry and clean. Use wet toilet paper or moist towelettes after a bowel movement.  Do not sit on the toilet for long periods of time. This increases blood pooling and pain.  Keep all follow-up visits as told by your health care provider. This is important. Contact a health care provider if you have:  Increasing pain and swelling that are not controlled by treatment or medicine.  Difficulty having a bowel movement, or you are unable to have a bowel movement.  Pain or inflammation outside the area of the hemorrhoids. Get help right away if you have:  Uncontrolled bleeding from your rectum. Summary  Hemorrhoids are swollen veins in and around the rectum or anus.  Most hemorrhoids can be managed with home treatments such as diet and lifestyle changes.  Taking warm sitz baths can help ease pain and discomfort.  In severe cases, procedures or surgery can be done to shrink or remove the hemorrhoids. This information is not intended to replace advice given to you by your health care provider. Make sure you discuss any questions you have with your health care provider. Document Revised: 04/18/2019 Document Reviewed: 04/11/2018 Elsevier Patient Education  [place].

## 2020-10-12 MED ORDER — PROCTOFOAM HC 1-1 % EX FOAM: 1.0000 | Freq: Two times a day (BID) | CUTANEOUS | 1 refills | Status: DC

## 2020-10-14 ENCOUNTER — Encounter: Payer: Self-pay | Admitting: Family Medicine

## 2020-12-24 ENCOUNTER — Ambulatory Visit: Payer: 59 | Admitting: Family Medicine

## 2020-12-24 ENCOUNTER — Other Ambulatory Visit: Payer: Self-pay

## 2020-12-24 ENCOUNTER — Encounter: Payer: Self-pay | Admitting: Family Medicine

## 2020-12-24 VITALS — BP 142/85 | HR 66 | Temp 97.6°F | Ht 65.0 in | Wt 152.4 lb

## 2020-12-24 DIAGNOSIS — F5101 Primary insomnia: Secondary | ICD-10-CM

## 2020-12-24 DIAGNOSIS — R058 Other specified cough: Secondary | ICD-10-CM

## 2020-12-24 DIAGNOSIS — I10 Essential (primary) hypertension: Secondary | ICD-10-CM

## 2020-12-24 DIAGNOSIS — F339 Major depressive disorder, recurrent, unspecified: Secondary | ICD-10-CM | POA: Diagnosis not present

## 2020-12-24 DIAGNOSIS — Z8249 Family history of ischemic heart disease and other diseases of the circulatory system: Secondary | ICD-10-CM | POA: Diagnosis not present

## 2020-12-24 DIAGNOSIS — T464X5A Adverse effect of angiotensin-converting-enzyme inhibitors, initial encounter: Secondary | ICD-10-CM

## 2020-12-24 MED ORDER — LOSARTAN POTASSIUM-HCTZ 100-12.5 MG PO TABS: 1.0000 | ORAL_TABLET | Freq: Every day | ORAL | 3 refills | Status: DC

## 2020-12-24 MED ORDER — TRAZODONE HCL 50 MG PO TABS: 50.0000 mg | ORAL_TABLET | Freq: Every day | ORAL | 3 refills | Status: DC

## 2020-12-24 NOTE — Progress Notes (Signed)
Subjective  CC:  Chief Complaint  Patient presents with  . Hypertension    HPI: Lauren Matthews is a 45 y.o. female who presents to the office today to address the problems listed above in the chief complaint.  Hypertension f/u: started lisiniopril hctz last visit. Feels fine but endorses cough since starting ACE. Mother had same. No other AEs. Hasn't checked bp at home. Feels well. No cp or sob. No edema.   Primary insomnia: doing better on trazodone. Typically takes 1/2 a pill unless very stressful day at work or has upcoming stressful day and knows she needs a good nights rest. Chronic problem. No AEs  Depression: stable on celexa. Mild increase in anxiety sxs due to work stressors.    Assessment  1. Essential hypertension   2. Family history of premature CAD   3. Primary insomnia   4. Major depression, recurrent, chronic ([place])      Plan    Hypertension f/u: BP control is inadequately controlled. Ace cough so change to ARB/hct and start home bp monitoring. ? If has white coat component. Discussed avoiding sodium and limiting caffeine and stimulants. Check renal and lytes today. See avs   prem CAD in family f/u: want excellent BP control. Discussed with patient. Will titrate up meds to get bp to 120/70.   Insomnia: better on meds. Counseled on importance of sleep and safety of meds. Refilled today  Mood is controlled. Monitor celexa with trazodone.   Education regarding management of these chronic disease states was given. Management strategies discussed on successive visits include dietary and exercise recommendations, goals of achieving and maintaining IBW, and lifestyle modifications aiming for adequate sleep and minimizing stressors.   Follow up: 6 weeks for recheck bp  Orders Placed This Encounter  Procedures  . Basic metabolic panel   Meds ordered this encounter  Medications  . losartan-hydrochlorothiazide (HYZAAR) 100-12.5 MG tablet    Sig: Take 1 tablet by  mouth daily.    Dispense:  90 tablet    Refill:  3  . traZODone (DESYREL) 50 MG tablet    Sig: Take 1 tablet (50 mg total) by mouth at bedtime.    Dispense:  90 tablet    Refill:  3      BP Readings from Last 3 Encounters:  12/24/20 (!) 142/85  10/11/20 134/86  09/21/20 (!) 164/110   Wt Readings from Last 3 Encounters:  12/24/20 152 lb 6.4 oz (69.1 kg)  10/11/20 151 lb 12.8 oz (68.9 kg)  09/21/20 149 lb 9.6 oz (67.9 kg)    Lab Results  Component Value Date   CHOL 177 09/21/2020   Lab Results  Component Value Date   HDL 58 09/21/2020   Lab Results  Component Value Date   LDLCALC 99 09/21/2020   Lab Results  Component Value Date   TRIG 108 09/21/2020   Lab Results  Component Value Date   CHOLHDL 3.1 09/21/2020   No results found for: LDLDIRECT Lab Results  Component Value Date   CREATININE 0.78 09/21/2020   BUN 6 (L) 09/21/2020   NA 137 09/21/2020   K 3.5 09/21/2020   CL 97 (L) 09/21/2020   CO2 29 09/21/2020    The 10-year ASCVD risk score Mikey Bussing DC Jr., et al., 2013) is: 0.8%   Values used to calculate the score:     Age: 9 years     Sex: Female     Is Non-Hispanic African American: No  Diabetic: No     Tobacco smoker: No     Systolic Blood Pressure: 782 mmHg     Is BP treated: Yes     HDL Cholesterol: 58 mg/dL     Total Cholesterol: 177 mg/dL  I reviewed the patients updated PMH, FH, and SocHx.    Patient Active Problem List   Diagnosis Date Noted  . Major depression, recurrent, chronic ([place]) 05/14/2020    Priority: High  . Family history of premature CAD 05/14/2020    Priority: High  . Essential hypertension     Priority: High  . ADD (attention deficit disorder) 03/11/2013    Priority: High  . Irritable bowel syndrome 05/14/2020    Priority: Medium  . Primary insomnia 05/14/2020    Priority: Medium  . History of DVT (deep vein thrombosis)     Priority: Medium  . Heterozygous for prothrombin II gene mutation 03/11/2013    Priority:  Medium  . Migraine, unspecified, without mention of intractable migraine without mention of status migrainosus 03/11/2013    Priority: Medium  . Fibrocystic breast changes, left 09/21/2020    Allergies: Patient has no known allergies.  Social History: Patient  reports that she quit smoking about 3 years ago. Her smoking use included cigarettes. She has a 2.40 pack-year smoking history. She has never used smokeless tobacco. She reports that she does not drink alcohol and does not use drugs.  Current Meds  Medication Sig  . citalopram (CELEXA) 10 MG tablet Take 1 tablet (10 mg total) by mouth at bedtime.  Marland Kitchen HYDROCORTISONE ACE, RECTAL, (PROCTOCORT) 30 MG SUPP Use per rectum bid x 5-7 days  . hydrocortisone-pramoxine (PROCTOFOAM HC) rectal foam Place 1 applicator rectally 2 (two) times daily.  Marland Kitchen losartan-hydrochlorothiazide (HYZAAR) 100-12.5 MG tablet Take 1 tablet by mouth daily.  . [DISCONTINUED] lisinopril-hydrochlorothiazide (ZESTORETIC) 10-12.5 MG tablet Take 1 tablet by mouth daily.  . [DISCONTINUED] traZODone (DESYREL) 50 MG tablet Take 50 mg by mouth at bedtime.    Review of Systems: Cardiovascular: negative for chest pain, palpitations, leg swelling, orthopnea Respiratory: negative for SOB, wheezing or persistent cough Gastrointestinal: negative for abdominal pain Genitourinary: negative for dysuria or gross hematuria  Objective  Vitals: BP (!) 142/85   Pulse 66   Temp 97.6 F (36.4 C) (Temporal)   Ht 5\' 5"  (1.651 m)   Wt 152 lb 6.4 oz (69.1 kg)   SpO2 100%   BMI 25.36 kg/m  General: no acute distress  Psych:  Alert and oriented, normal mood and affect HEENT:  Normocephalic, atraumatic, supple neck  Cardiovascular:  RRR without murmur. no edema Respiratory:  Good breath sounds bilaterally, CTAB with normal respiratory effort   Commons side effects, risks, benefits, and alternatives for medications and treatment plan prescribed today were discussed, and the patient  expressed understanding of the given instructions. Patient is instructed to call or message via MyChart if he/she has any questions or concerns regarding our treatment plan. No barriers to understanding were identified. We discussed Red Flag symptoms and signs in detail. Patient expressed understanding regarding what to do in case of urgent or emergency type symptoms.   Medication list was reconciled, printed and provided to the patient in AVS. Patient instructions and summary information was reviewed with the patient as documented in the AVS. This note was prepared with assistance of Dragon voice recognition software. Occasional wrong-word or sound-a-like substitutions may have occurred due to the inherent limitations of voice recognition software  This visit occurred during the SARS-CoV-2  public health emergency.  Safety protocols were in place, including screening questions prior to the visit, additional usage of staff PPE, and extensive cleaning of exam room while observing appropriate contact time as indicated for disinfecting solutions.

## 2020-12-24 NOTE — Patient Instructions (Signed)
Please return in 6 weeks for blood pressure recheck on new medication.  Stop the lisinopril hct and start losartan hct 100/12.5 Please start monitoring your blood pressures at home.  Ideally we want it to be 120s/70s but always < 140/90  If you have any questions or concerns, please don't hesitate to send me a message via MyChart or call the office at [phone]. Thank you for visiting with [place] today! It's our pleasure caring for you.

## 2020-12-25 LAB — BASIC METABOLIC PANEL
BUN: 9 mg/dL (ref 7–25)
CO2: 27 mmol/L (ref 20–32)
Calcium: 9.2 mg/dL (ref 8.6–10.2)
Chloride: 102 mmol/L (ref 98–110)
Creat: 0.84 mg/dL (ref 0.50–1.10)
Glucose, Bld: 89 mg/dL (ref 65–99)
Potassium: 3.7 mmol/L (ref 3.5–5.3)
Sodium: 140 mmol/L (ref 135–146)

## 2021-03-08 IMAGING — MG DIGITAL SCREENING BILATERAL MAMMOGRAM WITH TOMO AND CAD
8 series · 8 of 24 positions shown · non-contrast
Comparison: None.

CLINICAL DATA: Screening.

EXAM:
DIGITAL SCREENING BILATERAL MAMMOGRAM WITH TOMO AND CAD

[L CC synth-2D]
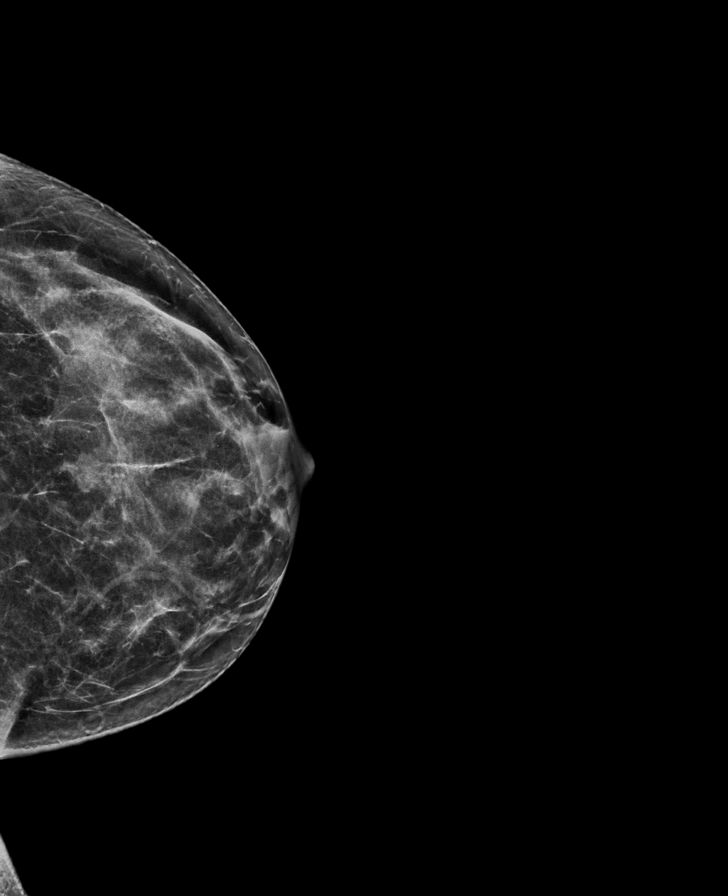

[R MLO synth-2D]
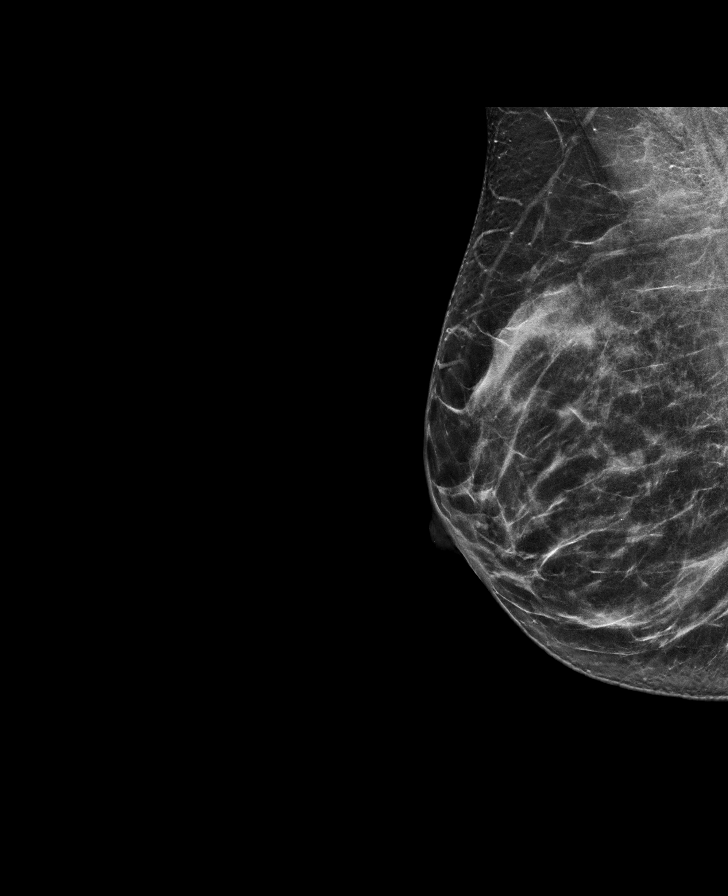

[R CC synth-2D]
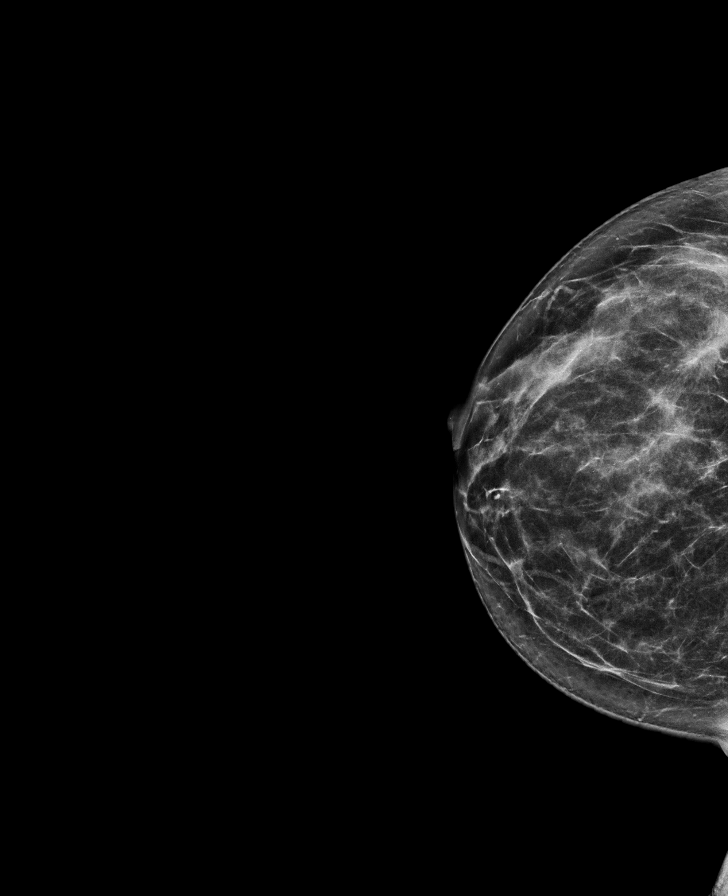

[L MLO synth-2D]
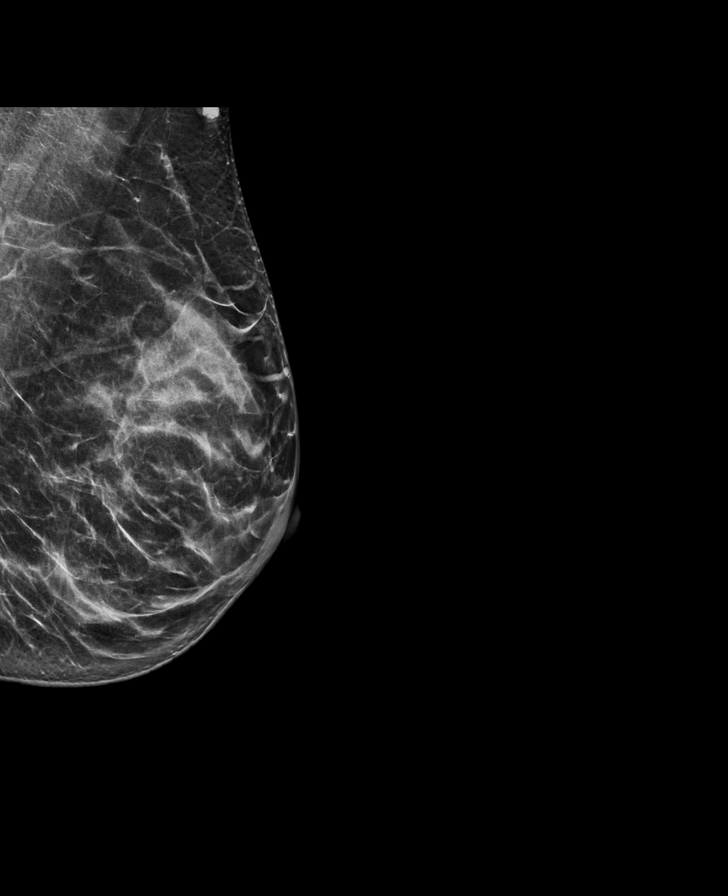

[R CC tomo · tomo slice 35/68.0]
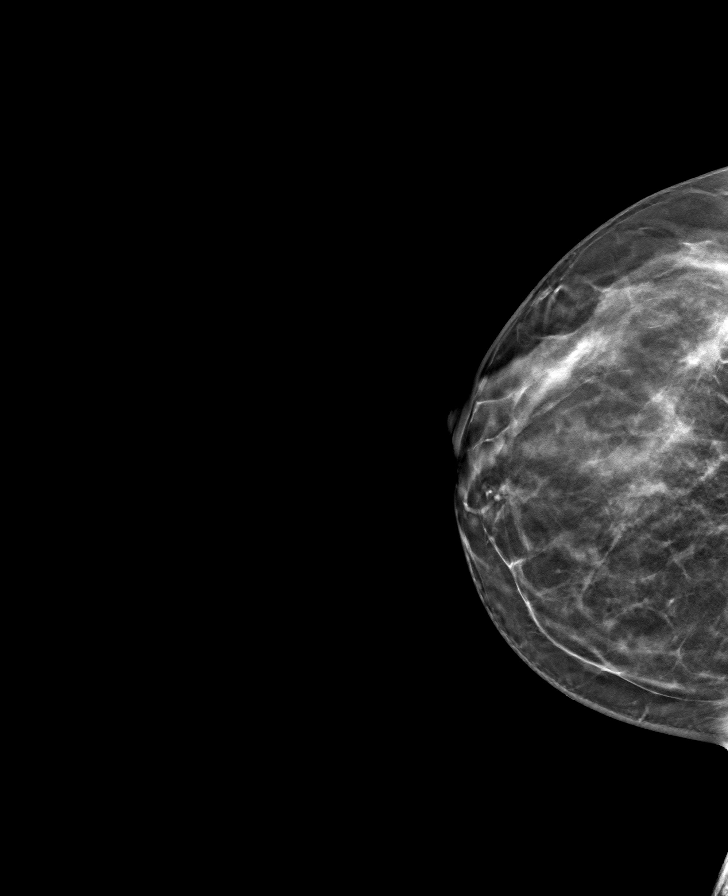

[L CC tomo · tomo slice 31/62.0]
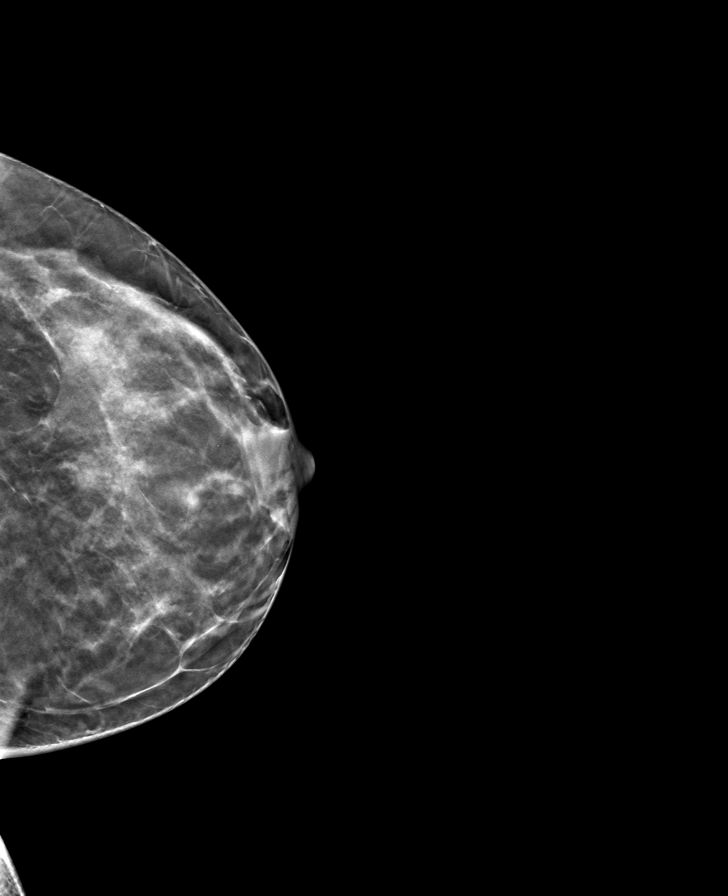

[L MLO tomo · tomo slice 34/67.0]
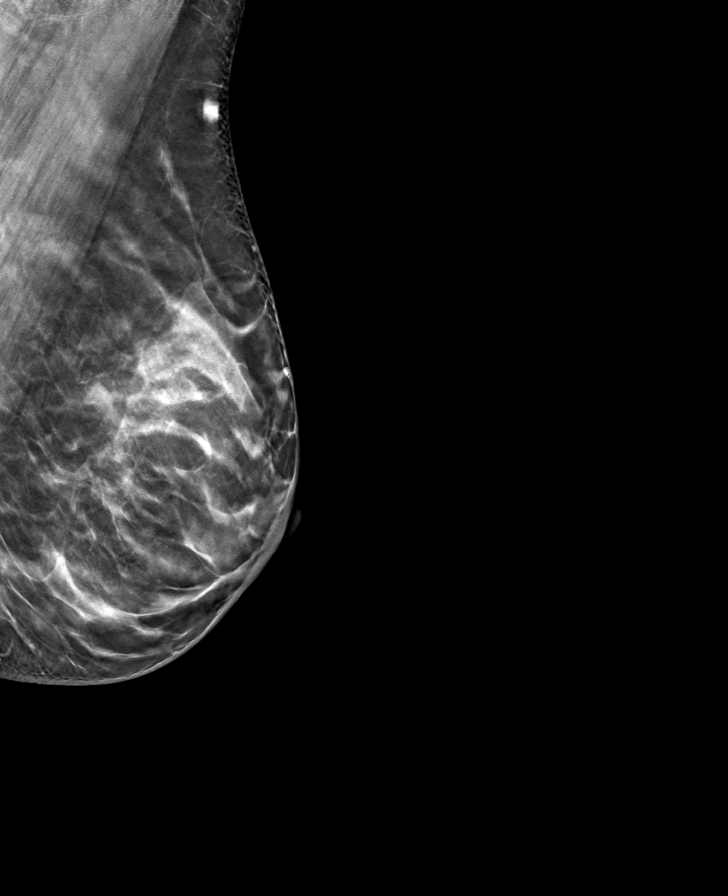

[R MLO tomo · tomo slice 36/71.0]
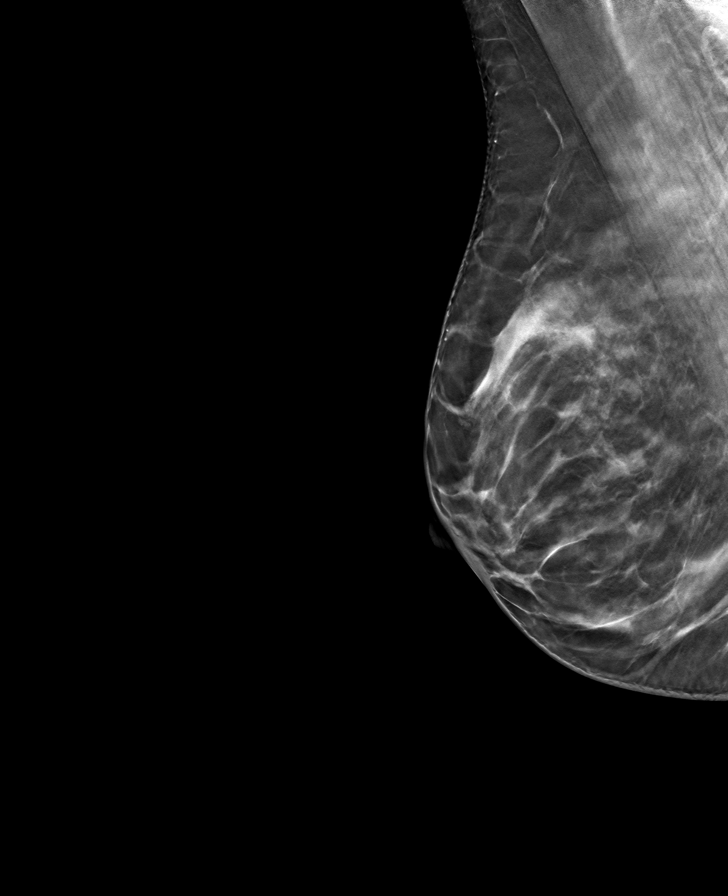

[8 of 24 positions shown; findings below may reference images not displayed]

ACR Breast Density Category c: The breast tissue is heterogeneously
dense, which may obscure small masses
FINDINGS: There are no findings suspicious for malignancy. Images were
processed with CAD.
IMPRESSION: No mammographic evidence of malignancy. A result letter of this
screening mammogram will be mailed directly to the patient.

RECOMMENDATION:
Screening mammogram in one year. (Code:EM-2-IHY)

BI-RADS CATEGORY  1: Negative.

## 2021-06-30 ENCOUNTER — Telehealth: Payer: Self-pay

## 2021-06-30 NOTE — Telephone Encounter (Signed)
FYI, pt is scheduled to see you tomorrow.

## 2021-06-30 NOTE — Telephone Encounter (Signed)
Patient is scheduled for tomorrow.   Initial Comment Caller, Tammy, from the office states pt has been dizzy for about a wk, her BP is high but she is not taking the BP. She has an appt at [facility] tomorrow. Translation No Disp. Time Lauren Matthews Time) Disposition Final User 06/30/2021 10:03:21 AM Attempt made - message left Doyle Askew RN, [facility] 06/30/2021 10:13:59 AM Attempt made - message left Doyle Askew, RN, [facility] 06/30/2021 10:24:33 AM FINAL ATTEMPT MADE - message left Yes Doyle Askew, RN, [employer]

## 2021-07-01 ENCOUNTER — Other Ambulatory Visit: Payer: Self-pay

## 2021-07-01 ENCOUNTER — Ambulatory Visit (INDEPENDENT_AMBULATORY_CARE_PROVIDER_SITE_OTHER): Payer: 59 | Admitting: Physician Assistant

## 2021-07-01 VITALS — BP 124/85 | HR 71 | Temp 97.0°F | Ht 65.0 in | Wt 154.0 lb

## 2021-07-01 DIAGNOSIS — R42 Dizziness and giddiness: Secondary | ICD-10-CM | POA: Diagnosis not present

## 2021-07-01 LAB — CBC WITH DIFFERENTIAL/PLATELET
Basophils Absolute: 0 10*3/uL (ref 0.0–0.1)
Basophils Relative: 0.6 % (ref 0.0–3.0)
Eosinophils Absolute: 0.1 10*3/uL (ref 0.0–0.7)
Eosinophils Relative: 1.5 % (ref 0.0–5.0)
HCT: 41 % (ref 36.0–46.0)
Hemoglobin: 14.1 g/dL (ref 12.0–15.0)
Lymphocytes Relative: 28.4 % (ref 12.0–46.0)
Lymphs Abs: 1.2 10*3/uL (ref 0.7–4.0)
MCHC: 34.4 g/dL (ref 30.0–36.0)
MCV: 95.4 fl (ref 78.0–100.0)
Monocytes Absolute: 0.4 10*3/uL (ref 0.1–1.0)
Monocytes Relative: 8.9 % (ref 3.0–12.0)
Neutro Abs: 2.6 10*3/uL (ref 1.4–7.7)
Neutrophils Relative %: 60.6 % (ref 43.0–77.0)
Platelets: 199 10*3/uL (ref 150.0–400.0)
RBC: 4.3 Mil/uL (ref 3.87–5.11)
RDW: 12.7 % (ref 11.5–15.5)
WBC: 4.3 10*3/uL (ref 4.0–10.5)

## 2021-07-01 LAB — COMPREHENSIVE METABOLIC PANEL
ALT: 11 U/L (ref 0–35)
AST: 16 U/L (ref 0–37)
Albumin: 4.4 g/dL (ref 3.5–5.2)
Alkaline Phosphatase: 46 U/L (ref 39–117)
BUN: 7 mg/dL (ref 6–23)
CO2: 27 mEq/L (ref 19–32)
Calcium: 9.3 mg/dL (ref 8.4–10.5)
Chloride: 101 mEq/L (ref 96–112)
Creatinine, Ser: 0.75 mg/dL (ref 0.40–1.20)
GFR: 97.94 mL/min (ref >60.00)
Glucose, Bld: 93 mg/dL (ref 70–99)
Potassium: 3.8 mEq/L (ref 3.5–5.1)
Sodium: 136 mEq/L (ref 135–145)
Total Bilirubin: 0.9 mg/dL (ref 0.2–1.2)
Total Protein: 6.6 g/dL (ref 6.0–8.3)

## 2021-07-01 LAB — TSH: TSH: 0.96 u[IU]/mL (ref 0.35–5.50)

## 2021-07-01 NOTE — Patient Instructions (Signed)
Good to meet you today! Please go to the lab for blood work and I will send results through [facility] [place]. Go to [facility] and get an adult rehydration bottle and start pushing fluids today. I think this may be some vertigo issues that were brought on by heat exhaustion and now exacerbated by your menstrual cycle. We will double check that all looks good on your labs though.

## 2021-07-01 NOTE — Telephone Encounter (Signed)
Pt here now for an appt
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
## 2021-09-20 ENCOUNTER — Other Ambulatory Visit: Payer: Self-pay | Admitting: Family Medicine

## 2021-09-22 ENCOUNTER — Other Ambulatory Visit (HOSPITAL_COMMUNITY)
Admission: RE | Admit: 2021-09-22 | Discharge: 2021-09-22 | Disposition: A | Discharge status: Home / Self Care | Payer: 59 | Source: Ambulatory Visit | Attending: Family Medicine | Admitting: Family Medicine

## 2021-09-22 ENCOUNTER — Ambulatory Visit (INDEPENDENT_AMBULATORY_CARE_PROVIDER_SITE_OTHER): Payer: 59 | Admitting: Family Medicine

## 2021-09-22 ENCOUNTER — Encounter: Payer: Self-pay | Admitting: Family Medicine

## 2021-09-22 ENCOUNTER — Other Ambulatory Visit: Payer: Self-pay

## 2021-09-22 VITALS — BP 120/86 | HR 81 | Temp 98.2°F | Ht 65.0 in | Wt 156.8 lb

## 2021-09-22 DIAGNOSIS — D6852 Prothrombin gene mutation: Secondary | ICD-10-CM

## 2021-09-22 DIAGNOSIS — F5101 Primary insomnia: Secondary | ICD-10-CM | POA: Diagnosis not present

## 2021-09-22 DIAGNOSIS — Z124 Encounter for screening for malignant neoplasm of cervix: Secondary | ICD-10-CM

## 2021-09-22 DIAGNOSIS — Z0000 Encounter for general adult medical examination without abnormal findings: Secondary | ICD-10-CM

## 2021-09-22 DIAGNOSIS — Z23 Encounter for immunization: Secondary | ICD-10-CM | POA: Diagnosis not present

## 2021-09-22 DIAGNOSIS — I10 Essential (primary) hypertension: Secondary | ICD-10-CM

## 2021-09-22 DIAGNOSIS — F339 Major depressive disorder, recurrent, unspecified: Secondary | ICD-10-CM | POA: Diagnosis not present

## 2021-09-22 DIAGNOSIS — Z86718 Personal history of other venous thrombosis and embolism: Secondary | ICD-10-CM

## 2021-09-22 LAB — CBC WITH DIFFERENTIAL/PLATELET
Basophils Absolute: 0 10*3/uL (ref 0.0–0.1)
Basophils Relative: 0.7 % (ref 0.0–3.0)
Eosinophils Absolute: 0.1 10*3/uL (ref 0.0–0.7)
Eosinophils Relative: 1.3 % (ref 0.0–5.0)
HCT: 41.9 % (ref 36.0–46.0)
Hemoglobin: 14.7 g/dL (ref 12.0–15.0)
Lymphocytes Relative: 22.4 % (ref 12.0–46.0)
Lymphs Abs: 1.3 10*3/uL (ref 0.7–4.0)
MCHC: 35.1 g/dL (ref 30.0–36.0)
MCV: 95.1 fl (ref 78.0–100.0)
Monocytes Absolute: 0.5 10*3/uL (ref 0.1–1.0)
Monocytes Relative: 8.6 % (ref 3.0–12.0)
Neutro Abs: 3.9 10*3/uL (ref 1.4–7.7)
Neutrophils Relative %: 67 % (ref 43.0–77.0)
Platelets: 208 10*3/uL (ref 150.0–400.0)
RBC: 4.4 Mil/uL (ref 3.87–5.11)
RDW: 12.5 % (ref 11.5–15.5)
WBC: 5.8 10*3/uL (ref 4.0–10.5)

## 2021-09-22 LAB — LIPID PANEL
Cholesterol: 164 mg/dL (ref 0–200)
HDL: 63.8 mg/dL (ref >39.00)
LDL Cholesterol: 87 mg/dL (ref 0–99)
NonHDL: 100.34
Total CHOL/HDL Ratio: 3
Triglycerides: 66 mg/dL (ref 0.0–149.0)
VLDL: 13.2 mg/dL (ref 0.0–40.0)

## 2021-09-22 LAB — COMPREHENSIVE METABOLIC PANEL
ALT: 14 U/L (ref 0–35)
AST: 19 U/L (ref 0–37)
Albumin: 4.5 g/dL (ref 3.5–5.2)
Alkaline Phosphatase: 45 U/L (ref 39–117)
BUN: 6 mg/dL (ref 6–23)
CO2: 26 mEq/L (ref 19–32)
Calcium: 9.2 mg/dL (ref 8.4–10.5)
Chloride: 100 mEq/L (ref 96–112)
Creatinine, Ser: 0.79 mg/dL (ref 0.40–1.20)
GFR: 91.87 mL/min (ref >60.00)
Glucose, Bld: 87 mg/dL (ref 70–99)
Potassium: 3.8 mEq/L (ref 3.5–5.1)
Sodium: 134 mEq/L — ABNORMAL LOW (ref 135–145)
Total Bilirubin: 1 mg/dL (ref 0.2–1.2)
Total Protein: 6.6 g/dL (ref 6.0–8.3)

## 2021-09-22 NOTE — Patient Instructions (Signed)
Please return in 6 months for hypertension follow up.  Please check your blood pressures at home: goal is 120s/70s or less. Let me know if your lower number is staying in the 80s.   I will release your lab results to you on your MyChart account with further instructions. Please reply with any questions.    Today you were given your Tdap vaccination.  This is good for 10 years.  If you have any questions or concerns, please don't hesitate to send me a message via MyChart or call the office at [phone]. Thank you for visiting with Lauren Matthews today! It's our pleasure caring for you.   Please do these things to maintain good health!  Exercise at least 30-45 minutes a day,  4-5 days a week.  Eat a low-fat diet with lots of fruits and vegetables, up to 7-9 servings per day. Drink plenty of water daily. Try to drink 8 8oz glasses per day. Seatbelts can save your life. Always wear your seatbelt. Place Smoke Detectors on every level of your home and check batteries every year. Schedule an appointment with an eye doctor for an eye exam every 1-2 years Safe sex - use condoms to protect yourself from STDs if you could be exposed to these types of infections. Use birth control if you do not want to become pregnant and are sexually active. Avoid heavy alcohol use. If you drink, keep it to less than 2 drinks/day and not every day. [name].  Choose someone you trust that could speak for you if you became unable to speak for yourself. Depression is common in our stressful world.If you're feeling down or losing interest in things you normally enjoy, please come in for a visit. If anyone is threatening or hurting you, please get help. Physical or Emotional Violence is never OK.
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
## 2021-09-23 LAB — CYTOLOGY - PAP
Comment: NEGATIVE
Diagnosis: NEGATIVE
High risk HPV: NEGATIVE

## 2021-11-26 ENCOUNTER — Other Ambulatory Visit: Payer: Self-pay | Admitting: Family Medicine

## 2022-02-21 ENCOUNTER — Other Ambulatory Visit: Payer: Self-pay | Admitting: Family Medicine

## 2022-03-06 ENCOUNTER — Encounter: Payer: Self-pay | Admitting: Family Medicine

## 2022-03-06 ENCOUNTER — Ambulatory Visit (INDEPENDENT_AMBULATORY_CARE_PROVIDER_SITE_OTHER): Payer: 59 | Admitting: Family Medicine

## 2022-03-06 VITALS — BP 150/93 | HR 59 | Temp 98.0°F | Ht 65.0 in

## 2022-03-06 DIAGNOSIS — K581 Irritable bowel syndrome with constipation: Secondary | ICD-10-CM

## 2022-03-06 DIAGNOSIS — I10 Essential (primary) hypertension: Secondary | ICD-10-CM | POA: Diagnosis not present

## 2022-03-06 DIAGNOSIS — K621 Rectal polyp: Secondary | ICD-10-CM | POA: Diagnosis not present

## 2022-03-06 DIAGNOSIS — K625 Hemorrhage of anus and rectum: Secondary | ICD-10-CM

## 2022-03-06 MED ORDER — HYDROCORTISONE ACETATE 30 MG RE SUPP
RECTAL | 0 refills | Status: DC
Start: 2022-03-06 — End: 2022-04-14
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
## 2022-03-06 NOTE — Patient Instructions (Addendum)
Please return in 4 weeks to recheck your blood pressure.  ? ?I have placed a referral to GI to take a look and remove the polyp. You can also discuss your IBS symptoms.  ?Start daily miralax and eat a healthy diet.  ? ?Please go to our [facility] office to get your xrays done. You can walk in M-F between 8:30am- noon or 1pm - 5pm. Tell them you are there for xrays ordered by me. They will send me the results, then I will let you know the results with instructions.  ? ?Address: [address].  The Xray department is located in the basement.  ? ?If you have any questions or concerns, please don't hesitate to send me a message via MyChart or call the office at [phone]. Thank you for visiting with [place] today! It's our pleasure caring for you.  ? ?Rectal Bleeding ?Rectal bleeding is when blood passes out of the opening between the buttocks (anus). People with rectal bleeding may notice bright red blood in their underwear or in the toilet after having a bowel movement. They may also have blood mixed with their stool (feces), or dark red or black stools. ?Rectal bleeding is usually a sign that something is wrong. Many things can cause rectal bleeding, including: ?Diverticulosis. This is a condition in which pockets or sacs project from the bowel. ?Hemorrhoids. These are blood vessels around the anus or inside the rectum that are larger than normal. ?Anal fissures. This is a tear in the anus. ?Proctitis and colitis. These are conditions in which the rectum, colon, or anus become inflamed. ?Polyps. These are growths that can be cancerous (malignant) or noncancerous (benign). ?Infections of the intestines. ?Fistulas. These are abnormal openings in the rectum and anus. ?Rectal prolapse. This is when a part of the rectum sticks out from the anus. ?Follow these instructions at home: ?Pay attention to any changes in your symptoms. Take these actions to help reduce bleeding and discomfort: ?Medicines ?Take  over-the-counter and prescription medicines only as told by your health care provider. ?Ask your health care provider about changing or stopping your regular medicines or supplements. This is especially important if you are taking blood thinners. Medicines that thin the blood can make rectal bleeding worse. ?Managing constipation ?Your condition may cause constipation. To prevent or treat constipation, or to help make your stools soft, you may need to: ?Drink enough fluid to keep your urine pale yellow. ?Take over-the-counter or prescription medicines. ?Eat foods that are high in fiber, such as beans, whole grains, and fresh fruits and vegetables. Ask your health care provider if you need a supplement to give you more fiber. ?Limit foods that are high in fat and processed sugars, such as fried or sweet foods. ? ?General instructions ?Try not to strain when having a bowel movement. ?Try taking a warm bath. This may help to soothe any pain in your rectum. ?Keep all follow-up visits as told by your health care provider. This is important. ?Contact a health care provider if you: ?Have pain or tenderness in your abdomen. ?Have a fever. ?Have weakness. ?Have nausea. ?Cannot have a bowel movement. ?Get help right away if you have: ?New or increased rectal bleeding. ?Black or dark red stools. ?Vomit with blood or something that looks like coffee grounds. ?A fainting episode. ?Severe pain in your rectum. ?Summary ?Rectal bleeding is usually a sign that something is wrong. This condition should be evaluated by a health care provider. ?Eat a  diet that is high in fiber. This will help keep your stools soft, making it easier to pass stools without straining. ?Medicines that thin the blood can make rectal bleeding worse. ?Get help right away if you have new or increased rectal bleeding, black or dark red stools, blood in your vomit, an episode of fainting, or severe pain in your rectum. ?This information is not intended to  replace advice given to you by your health care provider. Make sure you discuss any questions you have with your health care provider. ?Document Revised: 10/22/2019 Document Reviewed: 10/22/2019 ?Elsevier Patient Education ? [place]. ?Irritable Bowel Syndrome, Adult ?Irritable bowel syndrome (IBS) is a group of symptoms that affects the organs responsible for digestion (gastrointestinal or GI tract). IBS is not one specific disease. ?To regulate how the GI tract works, the body sends signals back and forth between the intestines and the brain. If you have IBS, there may be a problem with these signals. As a result, the GI tract does not function normally. The intestines may become more sensitive and overreact to certain things. This may be especially true when you eat certain foods or when you are under stress. ?There are four types of IBS. These may be determined based on the consistency of your stool (feces): ?IBS with diarrhea. ?IBS with constipation. ?Mixed IBS. ?Unsubtyped IBS. ?It is important to know which type of IBS you have. Certain treatments are more likely to be helpful for certain types of IBS. ?What are the causes? ?The exact cause of IBS is not known. ?What increases the risk? ?You may have a higher risk for IBS if you: ?Are female. ?Are younger than 73. ?Have a family history of IBS. ?Have a mental health condition, such as depression, anxiety, or post-traumatic stress disorder. ?Have had a bacterial infection of your GI tract. ?What are the signs or symptoms? ?Symptoms of IBS vary from person to person. The main symptom is abdominal pain or discomfort. Other symptoms usually include one or more of the following: ?Diarrhea, constipation, or both. ?Abdominal swelling or bloating. ?Feeling full after eating a small or regular-sized meal. ?Frequent gas. ?Mucus in the stool. ?A feeling of having more stool left after a bowel movement. ?Symptoms tend to come and go. They may be triggered by  stress, mental health conditions, or certain foods. ?How is this diagnosed? ?This condition may be diagnosed based on a physical exam, your medical history, and your symptoms. You may have tests, such as: ?Blood tests. ?Stool test. ?X-rays. ?CT scan. ?Colonoscopy. This is a procedure in which your GI tract is viewed with a long, thin, flexible tube. ?How is this treated? ?There is no cure for IBS, but treatment can help relieve symptoms. Treatment depends on the type of IBS you have, and may include: ?Changes to your diet, such as: ?Avoiding foods that cause symptoms. ?Drinking more water. ?Following a low-FODMAP (fermentable oligosaccharides, disaccharides, monosaccharides, and polyols) diet for up to 6 weeks, or as told by your health care provider. FODMAPs are sugars that are hard for some people to digest. ?Eating more fiber. ?Eating medium-sized meals at the same times every day. ?Medicines. These may include: ?Fiber supplements, if you have constipation. ?Medicine to control diarrhea (antidiarrheal medicines). ?Medicine to help control muscle tightening (spasms) in your GI tract (antispasmodic medicines). ?Medicines to help with mental health conditions, such as antidepressants or tranquilizers. ?Talk therapy or counseling. ?Working with a diet and nutrition specialist (dietitian) to help create a food plan that  is right for you. ?Managing your stress. ?Follow these instructions at home: ?Eating and drinking ?Eat a healthy diet. ?Eat medium-sized meals at about the same time every day. Do not eat large meals. ?Gradually eat more fiber-rich foods. These include whole grains, fruits, and vegetables. This may be especially helpful if you have IBS with constipation. ?Eat a diet low in FODMAPs. ?Drink enough fluid to keep your urine pale yellow. ?Keep a journal of foods that seem to trigger symptoms. ?Avoid foods and drinks that: ?Contain added sugar. ?Make your symptoms worse. Dairy products, caffeinated drinks,  and carbonated drinks can make symptoms worse for some people. ?General instructions ?Take over-the-counter and prescription medicines and supplements only as told by your health care provider. ?Get enough exercise

## 2022-03-07 LAB — CBC WITH DIFFERENTIAL/PLATELET
Basophils Absolute: 0.1 10*3/uL (ref 0.0–0.1)
Basophils Relative: 0.8 % (ref 0.0–3.0)
Eosinophils Absolute: 0.1 10*3/uL (ref 0.0–0.7)
Eosinophils Relative: 0.9 % (ref 0.0–5.0)
HCT: 39.6 % (ref 36.0–46.0)
Hemoglobin: 13.8 g/dL (ref 12.0–15.0)
Lymphocytes Relative: 28.4 % (ref 12.0–46.0)
Lymphs Abs: 1.8 10*3/uL (ref 0.7–4.0)
MCHC: 34.8 g/dL (ref 30.0–36.0)
MCV: 96 fl (ref 78.0–100.0)
Monocytes Absolute: 0.6 10*3/uL (ref 0.1–1.0)
Monocytes Relative: 9.8 % (ref 3.0–12.0)
Neutro Abs: 3.8 10*3/uL (ref 1.4–7.7)
Neutrophils Relative %: 60.1 % (ref 43.0–77.0)
Platelets: 187 10*3/uL (ref 150.0–400.0)
RBC: 4.12 Mil/uL (ref 3.87–5.11)
RDW: 12.6 % (ref 11.5–15.5)
WBC: 6.3 10*3/uL (ref 4.0–10.5)

## 2022-03-20 ENCOUNTER — Encounter: Payer: Self-pay | Admitting: Internal Medicine

## 2022-03-22 ENCOUNTER — Ambulatory Visit: Payer: 59 | Admitting: Family Medicine

## 2022-04-14 ENCOUNTER — Ambulatory Visit: Payer: 59 | Admitting: Internal Medicine

## 2022-04-14 ENCOUNTER — Encounter: Payer: Self-pay | Admitting: Internal Medicine

## 2022-04-14 ENCOUNTER — Telehealth: Payer: Self-pay

## 2022-04-14 VITALS — BP 110/84 | HR 76 | Ht 65.0 in | Wt 163.2 lb

## 2022-04-14 DIAGNOSIS — K625 Hemorrhage of anus and rectum: Secondary | ICD-10-CM

## 2022-04-14 DIAGNOSIS — K5900 Constipation, unspecified: Secondary | ICD-10-CM

## 2022-04-14 MED ORDER — NA SULFATE-K SULFATE-MG SULF 17.5-3.13-1.6 GM/177ML PO SOLN: 1.0000 | Freq: Once | ORAL | 0 refills | Status: AC

## 2022-04-14 MED ORDER — LINACLOTIDE 145 MCG PO CAPS: 145.0000 ug | ORAL_CAPSULE | Freq: Every day | ORAL | 3 refills | Status: DC

## 2022-04-14 NOTE — Telephone Encounter (Signed)
Request Reference Number: [id number]. LINZESS CAP 145MCG is approved through 04/15/2023.  ?Your patient may now fill this prescription and it will be covered. ?

## 2022-04-14 NOTE — Telephone Encounter (Signed)
Prior Authorization has been started for Linzess ?
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
## 2022-04-14 NOTE — Patient Instructions (Addendum)
If you are age 44 or younger, your body mass index should be between 19-25. Your Body mass index is 27.17 kg/m?Marland Kitchen If this is out of the aformentioned range listed, please consider follow up with your Primary Care Provider.  ?________________________________________________________ ? ?The [facility] providers would like to encourage you to use [facility] to communicate with providers for non-urgent requests or questions.  Due to long hold times on the telephone, sending your provider a message by [facility] may be a faster and more efficient way to get a response.  Please allow 48 business hours for a response.  Please remember that this is for non-urgent requests.  ?_______________________________________________________ ? ?You have been scheduled for a colonoscopy. Please follow written instructions given to you at your visit today.  ?Please pick up your prep supplies at the pharmacy within the next 1-3 days. ?If you use inhalers (even only as needed), please bring them with you on the day of your procedure. ? ?START Linzess 145 mcg 1 capsule prior to breakfast. ? ?Follow up pending the results of your Colonoscopy. ? ?Thank you for entrusting me with your care and choosing [facility]. ? ?Dr Lorenso Courier ?

## 2022-04-17 ENCOUNTER — Encounter: Payer: Self-pay | Admitting: Family Medicine

## 2022-04-17 ENCOUNTER — Ambulatory Visit: Payer: 59 | Admitting: Family Medicine

## 2022-04-17 VITALS — BP 122/70 | HR 61 | Temp 98.7°F | Ht 65.0 in | Wt 164.0 lb

## 2022-04-17 DIAGNOSIS — K625 Hemorrhage of anus and rectum: Secondary | ICD-10-CM | POA: Diagnosis not present

## 2022-04-17 DIAGNOSIS — K581 Irritable bowel syndrome with constipation: Secondary | ICD-10-CM

## 2022-04-17 DIAGNOSIS — I10 Essential (primary) hypertension: Secondary | ICD-10-CM

## 2022-04-17 NOTE — Progress Notes (Signed)
? ?Subjective  ?CC:  ?Chief Complaint  ?Patient presents with  ? Hypertension  ?  Pt here to f/U with Bp  ? ? ?HPI: Lauren Matthews is a 46 y.o. female who presents to the office today to address the problems listed above in the chief complaint. ?Hypertension f/u: Here for short-term follow-up for hypertension.  Blood pressure was very elevated at her last visit mostly due to anxiety regarding rectal bleeding.  Since, she continues to take her medications.  Blood pressure was normal at the GI office recently and again is normal today.  She continues on Hyzaar 100/12.5 daily.  No adverse effects ?Rectal bleeding and IBS with constipation: Reviewed GI notes.  She is scheduled for colonoscopy for further evaluation.  Started on Linzess 145 daily. ? ?Assessment  ?1. Essential hypertension   ?2. Irritable bowel syndrome with constipation   ? ?  ?Plan  ? ?Hypertension f/u: BP control is well controlled.  Reassured.  Continue Hyzaar 100/12.5 daily.  Well-controlled ?Rectal bleeding IBS: Educated on how to use Linzess.  Stop MiraLAX.  Stop if stools become loose.  Await colonoscopy results ? ?Education regarding management of these chronic disease states was given. Management strategies discussed on successive visits include dietary and exercise recommendations, goals of achieving and maintaining IBW, and lifestyle modifications aiming for adequate sleep and minimizing stressors.  ? ?Follow up: 6 months for complete physical and recheck ? ?No orders of the defined types were placed in this encounter. ? ?No orders of the defined types were placed in this encounter. ? ?  ? ?BP Readings from Last 3 Encounters:  ?04/17/22 122/70  ?04/14/22 110/84  ?03/06/22 (!) 150/93  ? ?Wt Readings from Last 3 Encounters:  ?04/17/22 164 lb (74.4 kg)  ?04/14/22 163 lb 4 oz (74 kg)  ?09/22/21 156 lb 12.8 oz (71.1 kg)  ? ? ?Lab Results  ?Component Value Date  ? CHOL 164 09/22/2021  ? CHOL 177 09/21/2020  ? ?Lab Results  ?Component Value Date  ?  HDL 63.80 09/22/2021  ? HDL 58 09/21/2020  ? ?Lab Results  ?Component Value Date  ? [place] 87 09/22/2021  ? [place] 99 09/21/2020  ? ?Lab Results  ?Component Value Date  ? TRIG 66.0 09/22/2021  ? TRIG 108 09/21/2020  ? ?Lab Results  ?Component Value Date  ? CHOLHDL 3 09/22/2021  ? CHOLHDL 3.1 09/21/2020  ? ?No results found for: LDLDIRECT ?Lab Results  ?Component Value Date  ? CREATININE 0.79 09/22/2021  ? BUN 6 09/22/2021  ? NA 134 (L) 09/22/2021  ? K 3.8 09/22/2021  ? CL 100 09/22/2021  ? CO2 26 09/22/2021  ? ? ?The 10-year ASCVD risk score (Arnett DK, et al., 2019) is: 0.5% ?  Values used to calculate the score: ?    Age: 17 years ?    Sex: Female ?    Is Non-Hispanic African American: No ?    Diabetic: No ?    Tobacco smoker: No ?    Systolic Blood Pressure: 326 mmHg ?    Is BP treated: Yes ?    HDL Cholesterol: 63.8 mg/dL ?    Total Cholesterol: 164 mg/dL ? ?I reviewed the patients updated PMH, FH, and SocHx.  ?  ?Patient Active Problem List  ? Diagnosis Date Noted  ? Major depression, recurrent, chronic ([place]) 05/14/2020  ?  Priority: High  ? Family history of premature CAD 05/14/2020  ?  Priority: High  ? Essential hypertension   ?  Priority: High  ? ADD (attention deficit disorder) 03/11/2013  ?  Priority: High  ? Irritable bowel syndrome 05/14/2020  ?  Priority: Medium   ? Primary insomnia 05/14/2020  ?  Priority: Medium   ? History of DVT (deep vein thrombosis)   ?  Priority: Medium   ? Heterozygous for prothrombin II gene mutation 03/11/2013  ?  Priority: Medium   ? Migraine, unspecified, without mention of intractable migraine without mention of status migrainosus 03/11/2013  ?  Priority: Medium   ? Cough due to ACE inhibitor 12/24/2020  ?  Priority: Low  ? Fibrocystic breast changes, left 09/21/2020  ?  Priority: Low  ? ? ?Allergies: Ace inhibitors ? ?Social History: ?Patient  reports that she quit smoking about 3 years ago. Her smoking use included cigarettes. She has a 2.40 pack-year smoking history.  She has never used smokeless tobacco. She reports current alcohol use. She reports that she does not use drugs. ? ?Current Meds  ?Medication Sig  ? citalopram (CELEXA) 10 MG tablet TAKE 1 TABLET BY MOUTH EVERYDAY AT BEDTIME  ? linaclotide (LINZESS) 145 MCG CAPS capsule Take 1 capsule (145 mcg total) by mouth daily before breakfast.  ? losartan-hydrochlorothiazide (HYZAAR) 100-12.5 MG tablet TAKE 1 TABLET BY MOUTH EVERY DAY  ? traZODone (DESYREL) 50 MG tablet TAKE 1 TABLET BY MOUTH EVERYDAY AT BEDTIME  ? ? ?Review of Systems: ?Cardiovascular: negative for chest pain, palpitations, leg swelling, orthopnea ?Respiratory: negative for SOB, wheezing or persistent cough ?Gastrointestinal: negative for abdominal pain ?Genitourinary: negative for dysuria or gross hematuria ? ?Objective  ?Vitals: BP 122/70   Pulse 61   Temp 98.7 ?F (37.1 ?C)   Ht '5\' 5"'$  (1.651 m)   Wt 164 lb (74.4 kg)   LMP 04/09/2022   SpO2 98%   BMI 27.29 kg/m?  ?General: no acute distress  ?Cardiovascular:  RRR without murmur. no edema ?Respiratory:  Good breath sounds bilaterally, CTAB with normal respiratory effort ?Neurologic:   Mental status is normal ?Commons side effects, risks, benefits, and alternatives for medications and treatment plan prescribed today were discussed, and the patient expressed understanding of the given instructions. Patient is instructed to call or message via MyChart if he/she has any questions or concerns regarding our treatment plan. No barriers to understanding were identified. We discussed Red Flag symptoms and signs in detail. Patient expressed understanding regarding what to do in case of urgent or emergency type symptoms.  ?Medication list was reconciled, printed and provided to the patient in AVS. Patient instructions and summary information was reviewed with the patient as documented in the AVS. ?This note was prepared with assistance of Systems analyst. Occasional wrong-word or sound-a-like  substitutions may have occurred due to the inherent limitations of voice recognition software ? ? ?

## 2022-04-17 NOTE — Patient Instructions (Signed)
Please return in 6 months for your annual complete physical; please come fasting.   If you have any questions or concerns, please don't hesitate to send me a message via MyChart or call the office at [phone]. Thank you for visiting with us today! It's our pleasure caring for you.  

## 2022-05-12 ENCOUNTER — Encounter: Payer: Self-pay | Admitting: Internal Medicine

## 2022-05-19 ENCOUNTER — Ambulatory Visit (AMBULATORY_SURGERY_CENTER): Payer: 59 | Admitting: Internal Medicine

## 2022-05-19 ENCOUNTER — Encounter: Payer: Self-pay | Admitting: Internal Medicine

## 2022-05-19 VITALS — BP 154/95 | HR 69 | Temp 98.7°F | Resp 12 | Ht 65.0 in | Wt 163.0 lb

## 2022-05-19 DIAGNOSIS — K625 Hemorrhage of anus and rectum: Secondary | ICD-10-CM

## 2022-05-19 DIAGNOSIS — K6289 Other specified diseases of anus and rectum: Secondary | ICD-10-CM | POA: Diagnosis not present

## 2022-05-19 DIAGNOSIS — D123 Benign neoplasm of transverse colon: Secondary | ICD-10-CM

## 2022-05-19 DIAGNOSIS — K648 Other hemorrhoids: Secondary | ICD-10-CM | POA: Diagnosis not present

## 2022-05-19 DIAGNOSIS — K635 Polyp of colon: Secondary | ICD-10-CM

## 2022-05-19 DIAGNOSIS — D124 Benign neoplasm of descending colon: Secondary | ICD-10-CM

## 2022-05-19 DIAGNOSIS — K621 Rectal polyp: Secondary | ICD-10-CM | POA: Diagnosis not present

## 2022-05-19 MED ORDER — SODIUM CHLORIDE 0.9 % IV SOLN: 500.0000 mL | Freq: Once | INTRAVENOUS | Status: DC

## 2022-05-19 MED ORDER — HYDROCORTISONE (PERIANAL) 2.5 % EX CREA: 1.0000 | TOPICAL_CREAM | Freq: Two times a day (BID) | CUTANEOUS | 1 refills | Status: AC

## 2022-05-19 NOTE — Op Note (Signed)
[name] Patient Name: Lauren Matthews Procedure Date: 05/19/2022 3:37 PM MRN: [id number] Endoscopist: Sonny Masters "Lauren Matthews ,  Age: 46 Referring MD:  Date of Birth: 09-Mar-1976 Gender: Female Account #: [id number] Procedure:                Colonoscopy Indications:              Rectal bleeding Medicines:                Monitored Anesthesia Care Procedure:                Pre-Anesthesia Assessment:                           - Prior to the procedure, a History and Physical                            was performed, and patient medications and                            allergies were reviewed. The patient's tolerance of                            previous anesthesia was also reviewed. The risks                            and benefits of the procedure and the sedation                            options and risks were discussed with the patient.                            All questions were answered, and informed consent                            was obtained. Prior Anticoagulants: The patient has                            taken no previous anticoagulant or antiplatelet                            agents. ASA Grade Assessment: II - A patient with                            mild systemic disease. After reviewing the risks                            and benefits, the patient was deemed in                            satisfactory condition to undergo the procedure.                           After obtaining informed consent, the colonoscope  was passed under direct vision. Throughout the                            procedure, the patient's blood pressure, pulse, and                            oxygen saturations were monitored continuously. The                            CF [id number] #[id number] was introduced through the anus                            and advanced to the the terminal ileum. The                            colonoscopy was performed without  difficulty. The                            patient tolerated the procedure well. The quality                            of the bowel preparation was good. The terminal                            ileum, ileocecal valve, appendiceal orifice, and                            rectum were photographed. Scope In: 3:47:01 PM Scope Out: 4:21:07 PM Scope Withdrawal Time: 0 hours 29 minutes 51 seconds  Total Procedure Duration: 0 hours 34 minutes 6 seconds  Findings:                 The terminal ileum appeared normal.                           Four sessile polyps were found in the descending                            colon and transverse colon. The polyps were 3 to 6                            mm in size. These polyps were removed with a cold                            snare. Resection and retrieval were complete.                           A 13 mm polyp was found in the transverse colon.                            The polyp was sessile. The polyp was removed with a  piecemeal technique using a cold snare. Resection                            and retrieval were complete.                           A localized area of erythematous mucosa was found                            in the rectum. This was biopsied with a cold                            forceps for histology.                           Non-bleeding internal hemorrhoids were found during                            retroflexion. Complications:            No immediate complications. Estimated Blood Loss:     Estimated blood loss was minimal. Impression:               - The examined portion of the ileum was normal.                           - Four 3 to 6 mm polyps in the descending colon and                            in the transverse colon, removed with a cold snare.                            Resected and retrieved.                           - One 13 mm polyp in the transverse colon, removed                             piecemeal using a cold snare. Resected and                            retrieved.                           - Erythematous mucosa in the rectum. Biopsied.                           - Non-bleeding internal hemorrhoids. Recommendation:           - Discharge patient to home (with escort).                           - Await pathology results.                           - The findings and recommendations were discussed  with the patient.                           - Return to GI clinic in 4 weeks. Sonny Masters "Lauren Matthews,  05/19/2022 4:25:18 PM

## 2022-05-19 NOTE — Patient Instructions (Signed)
Handout on polyps given.   YOU HAD AN ENDOSCOPIC PROCEDURE TODAY AT [facility]:   Refer to the procedure report that was given to you for any specific questions about what was found during the examination.  If the procedure report does not answer your questions, please call your gastroenterologist to clarify.  If you requested that your care partner not be given the details of your procedure findings, then the procedure report has been included in a sealed envelope for you to review at your convenience later.  YOU SHOULD EXPECT: Some feelings of bloating in the abdomen. Passage of more gas than usual.  Walking can help get rid of the air that was put into your GI tract during the procedure and reduce the bloating. If you had a lower endoscopy (such as a colonoscopy or flexible sigmoidoscopy) you may notice spotting of blood in your stool or on the toilet paper. If you underwent a bowel prep for your procedure, you may not have a normal bowel movement for a few days.  Please Note:  You might notice some irritation and congestion in your nose or some drainage.  This is from the oxygen used during your procedure.  There is no need for concern and it should clear up in a day or so.  SYMPTOMS TO REPORT IMMEDIATELY:  Following lower endoscopy (colonoscopy or flexible sigmoidoscopy):  Excessive amounts of blood in the stool  Significant tenderness or worsening of abdominal pains  Swelling of the abdomen that is new, acute  Fever of 100F or higher   For urgent or emergent issues, a gastroenterologist can be reached at any hour by calling [phone]. Do not use MyChart messaging for urgent concerns.    DIET:  We do recommend a small meal at first, but then you may proceed to your regular diet.  Drink plenty of fluids but you should avoid alcoholic beverages for 24 hours.  ACTIVITY:  You should plan to take it easy for the rest of today and you should NOT DRIVE or use heavy  machinery until tomorrow (because of the sedation medicines used during the test).    FOLLOW UP: Our staff will call the number listed on your records 24-72 hours following your procedure to check on you and address any questions or concerns that you may have regarding the information given to you following your procedure. If we do not reach you, we will leave a message.  We will attempt to reach you two times.  During this call, we will ask if you have developed any symptoms of COVID 19. If you develop any symptoms (ie: fever, flu-like symptoms, shortness of breath, cough etc.) before then, please call [phone].  If you test positive for Covid 19 in the 2 weeks post procedure, please call and report this information to [place].    If any biopsies were taken you will be contacted by phone or by letter within the next 1-3 weeks.  Please call us at [phone] if you have not heard about the biopsies in 3 weeks.    SIGNATURES/CONFIDENTIALITY: You and/or your care partner have signed paperwork which will be entered into your electronic medical record.  These signatures attest to the fact that that the information above on your After Visit Summary has been reviewed and is understood.  Full responsibility of the confidentiality of this discharge information lies with you and/or your care-partner.

## 2022-05-19 NOTE — Progress Notes (Unsigned)
A and O x3. Report to RN. Tolerated MAC anesthesia well. 
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
## 2022-05-19 NOTE — Progress Notes (Signed)
Called to room to assist during endoscopic procedure.  Patient ID and intended procedure confirmed with present staff. Received instructions for my participation in the procedure from the performing physician.  

## 2022-05-19 NOTE — Progress Notes (Unsigned)
VS completed by CW.   Pt's states no medical or surgical changes since previsit or office visit.  Patient stated that she drank maybe an ounce of fluids around 1:30 pm.

## 2022-05-22 ENCOUNTER — Telehealth: Payer: Self-pay

## 2022-05-22 NOTE — Telephone Encounter (Signed)
No answer, left message to call back later today, B.Maralyn Witherell RN. 

## 2022-05-22 NOTE — Telephone Encounter (Signed)
  Follow up Call-     05/19/2022    2:22 PM  Call back number  Post procedure Call Back phone  # [phone]  Permission to leave phone message Yes   Second follow up call, LVM

## 2022-05-23 ENCOUNTER — Other Ambulatory Visit: Payer: Self-pay

## 2022-05-24 ENCOUNTER — Encounter: Payer: Self-pay | Admitting: Internal Medicine

## 2022-05-25 ENCOUNTER — Encounter: Payer: Self-pay | Admitting: Family Medicine

## 2022-05-25 DIAGNOSIS — K635 Polyp of colon: Secondary | ICD-10-CM | POA: Insufficient documentation

## 2022-05-25 HISTORY — DX: Polyp of colon: K63.5

## 2022-06-23 ENCOUNTER — Encounter: Payer: Self-pay | Admitting: Internal Medicine

## 2022-06-23 ENCOUNTER — Ambulatory Visit (INDEPENDENT_AMBULATORY_CARE_PROVIDER_SITE_OTHER): Payer: 59 | Admitting: Internal Medicine

## 2022-06-23 VITALS — BP 118/78 | HR 71 | Ht 67.0 in | Wt 158.0 lb

## 2022-06-23 DIAGNOSIS — K5900 Constipation, unspecified: Secondary | ICD-10-CM | POA: Diagnosis not present

## 2022-06-23 DIAGNOSIS — K649 Unspecified hemorrhoids: Secondary | ICD-10-CM

## 2022-06-23 DIAGNOSIS — K625 Hemorrhage of anus and rectum: Secondary | ICD-10-CM

## 2022-06-23 MED ORDER — HYDROCORTISONE ACETATE 25 MG RE SUPP: 25.0000 mg | Freq: Two times a day (BID) | RECTAL | 0 refills | Status: AC

## 2022-06-23 NOTE — Patient Instructions (Addendum)
If you are age 44 or older, your body mass index should be between 23-30. Your Body mass index is 24.75 kg/m. If this is out of the aforementioned range listed, please consider follow up with your Primary Care Provider.  If you are age 47 or younger, your body mass index should be between 19-25. Your Body mass index is 24.75 kg/m. If this is out of the aformentioned range listed, please consider follow up with your Primary Care Provider.   ________________________________________________________  The [facility] providers would like to encourage you to use [facility] to communicate with providers for non-urgent requests or questions.  Due to long hold times on the telephone, sending your provider a message by [facility] may be a faster and more efficient way to get a response.  Please allow 48 business hours for a response.  Please remember that this is for non-urgent requests.  _______________________________________________________   We have sent the following medications to your pharmacy for you to pick up at your convenience: Anusol suppository'  Eat 2 kiwis a day.  Please purchase the following medications over the counter and take as directed: Fiber supplement such as Benefiber- use as directed daily  Thank you for entrusting me with your care and choosing [facility].  Dr. Lorenso Courier
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
## 2022-07-10 ENCOUNTER — Ambulatory Visit: Payer: 59 | Admitting: Family Medicine

## 2022-07-10 ENCOUNTER — Encounter: Payer: Self-pay | Admitting: Family Medicine

## 2022-07-10 VITALS — BP 120/80 | HR 72 | Temp 98.7°F | Ht 67.0 in | Wt 157.6 lb

## 2022-07-10 DIAGNOSIS — R300 Dysuria: Secondary | ICD-10-CM | POA: Diagnosis not present

## 2022-07-10 LAB — POCT URINALYSIS DIPSTICK
Bilirubin, UA: NEGATIVE
Blood, UA: NEGATIVE
Glucose, UA: NEGATIVE
Ketones, UA: 1
Leukocytes, UA: NEGATIVE
Nitrite, UA: NEGATIVE
Protein, UA: NEGATIVE
Spec Grav, UA: 1.01 (ref 1.010–1.025)
Urobilinogen, UA: 0.2 E.U./dL
pH, UA: 6 (ref 5.0–8.0)

## 2022-07-10 MED ORDER — NITROFURANTOIN MONOHYD MACRO 100 MG PO CAPS: 100.0000 mg | ORAL_CAPSULE | Freq: Two times a day (BID) | ORAL | 0 refills | Status: DC

## 2022-07-10 NOTE — Patient Instructions (Signed)
Please return in 3 months for your annual complete physical; please come fasting.   I will send your urine in for a culture to see if it is infected. You may take the antibiotics as prescribed. We will call with those results in a few days.   If you have any questions or concerns, please don't hesitate to send me a message via MyChart or call the office at [phone]. Thank you for visiting with [place] today! It's our pleasure caring for you.

## 2022-07-10 NOTE — Progress Notes (Signed)
Subjective   CC:  Chief Complaint  Patient presents with   Urinary Frequency    Pt stated that she has been experiencing some frequent urination since 07/09/2022. It hurts when she has to pee.     HPI: Lauren Matthews is a 46 y.o. female who presents to the office today to address the problems listed above in the chief complaint. Patient reports dysuria and urinary frequency.  She has sensation of increased urinary pressure.  She denies fevers flank pain nausea vomiting or gross hematuria.  Symptoms have been present for several hours to days.  She denies history of interstitial cystitis.  She denies vaginal symptoms including vaginal discharge or pelvic pain.   Assessment  1. Dysuria      Plan  She has been on AZO so dipstick is not completely reliable although it looks good. Will treat empirically with macrobid and send in urine for culture  Follow up: 3 mo for cpe  Orders Placed This Encounter  Procedures   Urine Culture   POCT Urinalysis Dipstick   Meds ordered this encounter  Medications   nitrofurantoin, macrocrystal-monohydrate, (MACROBID) 100 MG capsule    Sig: Take 1 capsule (100 mg total) by mouth 2 (two) times daily.    Dispense:  10 capsule    Refill:  0      I reviewed the patients updated PMH, FH, and SocHx.    Patient Active Problem List   Diagnosis Date Noted   Major depression, recurrent, chronic ([place]) 05/14/2020    Priority: High   Family history of premature CAD 05/14/2020    Priority: High   Essential hypertension     Priority: High   ADD (attention deficit disorder) 03/11/2013    Priority: High   Irritable bowel syndrome 05/14/2020    Priority: Medium    Primary insomnia 05/14/2020    Priority: Medium    History of DVT (deep vein thrombosis)     Priority: Medium    Heterozygous for prothrombin II gene mutation 03/11/2013    Priority: Medium    Migraine, unspecified, without mention of intractable migraine without mention of status migrainosus  03/11/2013    Priority: Medium    Cough due to ACE inhibitor 12/24/2020    Priority: Low   Fibrocystic breast changes, left 09/21/2020    Priority: Low   Serrated polyp of colon 05/25/2022   Current Meds  Medication Sig   citalopram (CELEXA) 10 MG tablet TAKE 1 TABLET BY MOUTH EVERYDAY AT BEDTIME   linaclotide (LINZESS) 145 MCG CAPS capsule Take 1 capsule (145 mcg total) by mouth daily before breakfast.   losartan-hydrochlorothiazide (HYZAAR) 100-12.5 MG tablet TAKE 1 TABLET BY MOUTH EVERY DAY   nitrofurantoin, macrocrystal-monohydrate, (MACROBID) 100 MG capsule Take 1 capsule (100 mg total) by mouth 2 (two) times daily.   traZODone (DESYREL) 50 MG tablet TAKE 1 TABLET BY MOUTH EVERYDAY AT BEDTIME    Review of Systems: Cardiovascular: negative for chest pain Respiratory: negative for SOB or persistent cough Gastrointestinal: negative for abdominal pain Constitutional: Negative for fever malaise or anorexia  Objective  Vitals: BP 120/80   Pulse 72   Temp 98.7 F (37.1 C)   Ht '5\' 7"'$  (1.702 m)   Wt 157 lb 9.6 oz (71.5 kg)   SpO2 98%   BMI 24.68 kg/m  General: no acute distress  Psych:  Alert and oriented, normal mood and affect  Gastrointestinal: soft, flat abdomen,  NO CVAT, mild suprapubic ttp w/o rebound or guarding  Office Visit on 07/10/2022 ON AZO  Component Date Value Ref Range Status   Color, UA 07/10/2022 Amber   Final   Clarity, UA 07/10/2022 clear   Final   Glucose, UA 07/10/2022 Negative  Negative Final   Bilirubin, UA 07/10/2022 negative   Final   Ketones, UA 07/10/2022 1   Final   Spec Grav, UA 07/10/2022 1.010  1.010 - 1.025 Final   Blood, UA 07/10/2022 negative   Final   pH, UA 07/10/2022 6.0  5.0 - 8.0 Final   Protein, UA 07/10/2022 Negative  Negative Final   Urobilinogen, UA 07/10/2022 0.2  0.2 or 1.0 E.U./dL Final   Nitrite, UA 07/10/2022 negative   Final   Leukocytes, UA 07/10/2022 Negative  Negative Final   Commons side effects, risks, benefits,  and alternatives for medications and treatment plan prescribed today were discussed, and the patient expressed understanding of the given instructions. Patient is instructed to call or message via MyChart if he/she has any questions or concerns regarding our treatment plan. No barriers to understanding were identified. We discussed Red Flag symptoms and signs in detail. Patient expressed understanding regarding what to do in case of urgent or emergency type symptoms.  Medication list was reconciled, printed and provided to the patient in AVS. Patient instructions and summary information was reviewed with the patient as documented in the AVS. This note was prepared with assistance of Dragon voice recognition software. Occasional wrong-word or sound-a-like substitutions may have occurred due to the inherent limitations of voice recognition software

## 2022-07-13 ENCOUNTER — Other Ambulatory Visit: Payer: Self-pay

## 2022-07-13 DIAGNOSIS — R300 Dysuria: Secondary | ICD-10-CM

## 2022-07-13 LAB — URINE CULTURE
MICRO NUMBER:: 13744780
SPECIMEN QUALITY:: ADEQUATE

## 2022-07-13 MED ORDER — AMOXICILLIN 500 MG PO TABS: 500.0000 mg | ORAL_TABLET | Freq: Two times a day (BID) | ORAL | 0 refills | Status: AC

## 2022-07-13 NOTE — Progress Notes (Signed)
Please call patient: I have reviewed his/her lab results. Please call to see if pt still with symptoms. Her urine did show some bacteria (but low counts) but bacteria is resistant to the macrobid that I prescribed. IF all better, nothing further is needed.  IF still with sxs: please order amox '500mg'$  bid x 3 days. thanks

## 2022-08-11 DIAGNOSIS — N3090 Cystitis, unspecified without hematuria: Secondary | ICD-10-CM

## 2022-08-11 HISTORY — DX: Cystitis, unspecified without hematuria: N30.90

## 2022-08-16 ENCOUNTER — Encounter: Payer: Self-pay | Admitting: Internal Medicine

## 2022-08-17 ENCOUNTER — Other Ambulatory Visit: Payer: Self-pay

## 2022-08-17 MED ORDER — HYDROCORTISONE (PERIANAL) 2.5 % EX CREA: 1.0000 | TOPICAL_CREAM | Freq: Two times a day (BID) | CUTANEOUS | 0 refills | Status: AC

## 2022-08-28 ENCOUNTER — Encounter: Payer: Self-pay | Admitting: *Deleted

## 2022-09-12 ENCOUNTER — Ambulatory Visit: Payer: 59 | Admitting: Internal Medicine

## 2022-09-12 ENCOUNTER — Encounter: Payer: Self-pay | Admitting: Internal Medicine

## 2022-09-12 VITALS — BP 118/80 | HR 82 | Ht 67.0 in | Wt 149.0 lb

## 2022-09-12 DIAGNOSIS — K649 Unspecified hemorrhoids: Secondary | ICD-10-CM | POA: Diagnosis not present

## 2022-09-12 DIAGNOSIS — K5900 Constipation, unspecified: Secondary | ICD-10-CM

## 2022-09-12 NOTE — Progress Notes (Signed)
PROCEDURE NOTE: The patient presents with symptomatic grade 2  hemorrhoids, requesting rubber band ligation of his/her hemorrhoidal disease.  All risks, benefits and alternative forms of therapy were described and informed consent was obtained.  In the Left Lateral Decubitus position anoscopic examination revealed grade 2 hemorrhoids in the right anterior, left anterior, and posterior position(s). Patient does also have a skin tag. The anorectum was pre-medicated with nitroglycerin and Recticare The decision was made to band the posterior internal hemorrhoid, and the [place] was used to perform band ligation without complication.  Digital anorectal examination was then performed to assure proper positioning of the band, and to adjust the banded tissue as required.  The patient was discharged home without pain or other issues.  Dietary and behavioral recommendations were given and along with follow-up instructions.     The following adjunctive treatments were recommended: Continue Miralax and Linzess. Continue fiber supplement and hydration. Continue physical activity.  The patient will return in 2-4 weeks for  follow-up and possible additional banding as required. No complications were encountered and the patient tolerated the procedure well.

## 2022-09-12 NOTE — Patient Instructions (Addendum)
If you are age 44 or older, your body mass index should be between 23-30. Your Body mass index is 23.34 kg/m. If this is out of the aforementioned range listed, please consider follow up with your Primary Care Provider.  If you are age 60 or younger, your body mass index should be between 19-25. Your Body mass index is 23.34 kg/m. If this is out of the aformentioned range listed, please consider follow up with your Primary Care Provider.   ________________________________________________________   [place]   The procedure you have had should have been relatively painless since the banding of the area involved does not have nerve endings and there is no pain sensation.  The rubber band cuts off the blood supply to the hemorrhoid and the band may fall off as soon as 48 hours after the banding (the band may occasionally be seen in the toilet bowl following a bowel movement). You may notice a temporary feeling of fullness in the rectum which should respond adequately to plain Tylenol or Motrin.  Following the banding, avoid strenuous exercise that evening and resume full activity the next day.  A sitz bath (soaking in a warm tub) or bidet is soothing, and can be useful for cleansing the area after bowel movements.     To avoid constipation, take two tablespoons of natural wheat bran, natural oat bran, flax, Benefiber or any over the counter fiber supplement and increase your water intake to 7-8 glasses daily.    Unless you have been prescribed anorectal medication, do not put anything inside your rectum for two weeks: No suppositories, enemas, fingers, etc.  Occasionally, you may have more bleeding than usual after the banding procedure.  This is often from the untreated hemorrhoids rather than the treated one.  Don't be concerned if there is a tablespoon or so of blood.  If there is more blood than this, lie flat with your bottom higher than your head and  apply an ice pack to the area. If the bleeding does not stop within a half an hour or if you feel faint, call our office at [phone] or go to the emergency room.  Problems are not common; however, if there is a substantial amount of bleeding, severe pain, chills, fever or difficulty passing urine (very rare) or other problems, you should call us at [phone] or report to the nearest emergency room.  Do not stay seated continuously for more than 2-3 hours for a day or two after the procedure.  Tighten your buttock muscles 10-15 times every two hours and take 10-15 deep breaths every 1-2 hours.  Do not spend more than a few minutes on the toilet if you cannot empty your bowel; instead re-visit the toilet at a later time.  You have been scheduled for a 2nd hemorrhoid banding appointment on Wednesday, 11-1 at 9:30 am.  If you need to reschedule this appointment please call [phone] as soon as possible. Thank you.  Thank you for entrusting me with your care and for choosing [facility], Dr. [name]

## 2022-09-18 ENCOUNTER — Other Ambulatory Visit: Payer: Self-pay | Admitting: Family Medicine

## 2022-09-29 ENCOUNTER — Encounter: Payer: Self-pay | Admitting: Internal Medicine

## 2022-10-04 ENCOUNTER — Encounter (INDEPENDENT_AMBULATORY_CARE_PROVIDER_SITE_OTHER): Payer: 59 | Admitting: Family Medicine

## 2022-10-04 ENCOUNTER — Encounter: Payer: 59 | Admitting: Internal Medicine

## 2022-10-04 DIAGNOSIS — N3000 Acute cystitis without hematuria: Secondary | ICD-10-CM | POA: Diagnosis not present

## 2022-10-04 MED ORDER — NITROFURANTOIN MONOHYD MACRO 100 MG PO CAPS: 100.0000 mg | ORAL_CAPSULE | Freq: Two times a day (BID) | ORAL | 0 refills | Status: DC

## 2022-10-04 NOTE — Telephone Encounter (Signed)
A total of 7 minutes were spent by me to personally review the patient-generated inquiry, review patient records and data pertinent to assessment of the patient's problem, develop a management plan including generation of prescriptions and/or orders, and on subsequent communication with the patient through secure the MyChart portal service. There is no separately reported E/M service related to this service in the past 7 days nor does the patient have an upcoming soonest available appointment for this issue. This work was completed in less than 7 days.      The codes to be used for the E/M service are:  99421 for five-10 minutes of time spent on the inquiry.  

## 2022-10-19 NOTE — Telephone Encounter (Signed)
Careplan: Pt with sxs consistent with UTI>  Will empirically treat with macrobid 100 bid x 5days Pt w/o red flag sxs Told to make office visit if sxs do not resolve.

## 2022-10-20 ENCOUNTER — Encounter: Payer: Self-pay | Admitting: Internal Medicine

## 2022-10-20 ENCOUNTER — Ambulatory Visit: Payer: 59 | Admitting: Internal Medicine

## 2022-10-20 VITALS — BP 138/80 | HR 75 | Ht 67.0 in | Wt 147.2 lb

## 2022-10-20 DIAGNOSIS — K641 Second degree hemorrhoids: Secondary | ICD-10-CM

## 2022-10-20 DIAGNOSIS — K649 Unspecified hemorrhoids: Secondary | ICD-10-CM

## 2022-10-20 NOTE — Progress Notes (Signed)
PROCEDURE NOTE: The patient presents with symptomatic grade 2 hemorrhoids, requesting rubber band ligation of his/her hemorrhoidal disease.  All risks, benefits and alternative forms of therapy were described and informed consent was obtained.  In the Left Lateral Decubitus position the anorectum was pre-medicated with nitroglycerin and Recticare The decision was made to band the left anterior internal hemorrhoid, and the [place] was used to perform band ligation without complication.  Digital anorectal examination was then performed to assure proper positioning of the band, and to adjust the banded tissue as required.  The patient was discharged home without pain or other issues.  Dietary and behavioral recommendations were given and along with follow-up instructions.     The following adjunctive treatments were recommended: Continue Miralax and Linzess.  The patient will return in 2-4 weeks for  follow-up and possible additional banding as required. No complications were encountered and the patient tolerated the procedure well.

## 2022-10-20 NOTE — Patient Instructions (Addendum)
If you are age 44 or older, your body mass index should be between 23-30. Your Body mass index is 23.05 kg/m. If this is out of the aforementioned range listed, please consider follow up with your Primary Care Provider.  If you are age 25 or younger, your body mass index should be between 19-25. Your Body mass index is 23.05 kg/m. If this is out of the aformentioned range listed, please consider follow up with your Primary Care Provider.   ________________________________________________________  The [place] [facility] providers would like to encourage you to use [facility] to communicate with providers for non-urgent requests or questions.  Due to long hold times on the telephone, sending your provider a message by [facility] may be a faster and more efficient way to get a response.  Please allow 48 business hours for a response.  Please remember that this is for non-urgent requests.   HEMORRHOID BANDING PROCEDURE    FOLLOW-UP CARE   The procedure you have had should have been relatively painless since the banding of the area involved does not have nerve endings and there is no pain sensation.  The rubber band cuts off the blood supply to the hemorrhoid and the band may fall off as soon as 48 hours after the banding (the band may occasionally be seen in the toilet bowl following a bowel movement). You may notice a temporary feeling of fullness in the rectum which should respond adequately to plain Tylenol or Motrin.  Following the banding, avoid strenuous exercise that evening and resume full activity the next day.  A sitz bath (soaking in a warm tub) or bidet is soothing, and can be useful for cleansing the area after bowel movements.     To avoid constipation, take two tablespoons of natural wheat bran, natural oat bran, flax, Benefiber or any over the counter fiber supplement and increase your water intake to 7-8 glasses daily.    Unless you have been prescribed anorectal medication, do not put  anything inside your rectum for two weeks: No suppositories, enemas, fingers, etc.  Occasionally, you may have more bleeding than usual after the banding procedure.  This is often from the untreated hemorrhoids rather than the treated one.  Don't be concerned if there is a tablespoon or so of blood.  If there is more blood than this, lie flat with your bottom higher than your head and apply an ice pack to the area. If the bleeding does not stop within a half an hour or if you feel faint, call our office at [phone] or go to the emergency room.  Problems are not common; however, if there is a substantial amount of bleeding, severe pain, chills, fever or difficulty passing urine (very rare) or other problems, you should call us at [phone] or report to the nearest emergency room.  Do not stay seated continuously for more than 2-3 hours for a day or two after the procedure.  Tighten your buttock muscles 10-15 times every two hours and take 10-15 deep breaths every 1-2 hours.  Do not spend more than a few minutes on the toilet if you cannot empty your bowel; instead re-visit the toilet at a later time. + You are schedule to follow up on 11/21/22 at 9:30 am for 3rd banding   Thank you for entrusting me with your care and choosing [facility].  Dr Lorenso Courier

## 2022-10-30 ENCOUNTER — Other Ambulatory Visit (HOSPITAL_COMMUNITY)
Admission: RE | Admit: 2022-10-30 | Discharge: 2022-10-30 | Disposition: A | Discharge status: Home / Self Care | Payer: 59 | Source: Ambulatory Visit | Attending: Family Medicine | Admitting: Family Medicine

## 2022-10-30 ENCOUNTER — Encounter: Payer: Self-pay | Admitting: Family Medicine

## 2022-10-30 ENCOUNTER — Ambulatory Visit: Payer: 59 | Admitting: Family Medicine

## 2022-10-30 VITALS — BP 110/80 | HR 64 | Temp 97.9°F | Ht 67.0 in | Wt 145.0 lb

## 2022-10-30 DIAGNOSIS — R350 Frequency of micturition: Secondary | ICD-10-CM

## 2022-10-30 LAB — POCT URINALYSIS DIPSTICK
Bilirubin, UA: NEGATIVE
Blood, UA: NEGATIVE
Glucose, UA: NEGATIVE
Ketones, UA: NEGATIVE
Leukocytes, UA: NEGATIVE
Nitrite, UA: NEGATIVE
Protein, UA: NEGATIVE
Spec Grav, UA: 1.01 (ref 1.010–1.025)
Urobilinogen, UA: 0.2 E.U./dL
pH, UA: 6 (ref 5.0–8.0)

## 2022-10-30 MED ORDER — AMOXICILLIN-POT CLAVULANATE 875-125 MG PO TABS: 1.0000 | ORAL_TABLET | Freq: Two times a day (BID) | ORAL | 0 refills | Status: DC

## 2022-10-30 NOTE — Patient Instructions (Signed)
An antibiotic has been sent to your pharmacy, start today. We will call or notify you via MyChart if the urine culture indicates a different antibiotic to be used. Drink at least 2 liters = 64oz = 8 cups of water daily.
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
## 2022-10-31 LAB — URINE CYTOLOGY ANCILLARY ONLY
Chlamydia: NEGATIVE
Comment: NEGATIVE
Comment: NORMAL
Neisseria Gonorrhea: NEGATIVE

## 2022-10-31 LAB — URINE CULTURE
MICRO NUMBER:: 14233825
SPECIMEN QUALITY:: ADEQUATE

## 2022-11-08 ENCOUNTER — Ambulatory Visit (INDEPENDENT_AMBULATORY_CARE_PROVIDER_SITE_OTHER): Payer: 59 | Admitting: Family Medicine

## 2022-11-08 ENCOUNTER — Encounter: Payer: Self-pay | Admitting: Family Medicine

## 2022-11-08 VITALS — BP 130/70 | HR 74 | Temp 97.7°F | Ht 67.0 in | Wt 145.2 lb

## 2022-11-08 DIAGNOSIS — Z0000 Encounter for general adult medical examination without abnormal findings: Secondary | ICD-10-CM

## 2022-11-08 DIAGNOSIS — D6852 Prothrombin gene mutation: Secondary | ICD-10-CM

## 2022-11-08 DIAGNOSIS — Z8249 Family history of ischemic heart disease and other diseases of the circulatory system: Secondary | ICD-10-CM

## 2022-11-08 DIAGNOSIS — F988 Other specified behavioral and emotional disorders with onset usually occurring in childhood and adolescence: Secondary | ICD-10-CM

## 2022-11-08 DIAGNOSIS — K635 Polyp of colon: Secondary | ICD-10-CM

## 2022-11-08 DIAGNOSIS — F5101 Primary insomnia: Secondary | ICD-10-CM

## 2022-11-08 DIAGNOSIS — I10 Essential (primary) hypertension: Secondary | ICD-10-CM | POA: Diagnosis not present

## 2022-11-08 DIAGNOSIS — K581 Irritable bowel syndrome with constipation: Secondary | ICD-10-CM

## 2022-11-08 DIAGNOSIS — F339 Major depressive disorder, recurrent, unspecified: Secondary | ICD-10-CM | POA: Diagnosis not present

## 2022-11-08 NOTE — Patient Instructions (Signed)
Please return in 6 months for hypertension follow up.   Please follow up as scheduled for your next visit with me: Visit date not found   If you have any questions or concerns, please don't hesitate to send me a message via MyChart or call the office at [phone]. Thank you for visiting with [place] today! It's our pleasure caring for you.   If you have any questions or concerns, please don't hesitate to send me a message via MyChart or call the office at [phone]. Thank you for visiting with [place] today! It's our pleasure caring for you.
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
## 2022-11-09 LAB — CBC WITH DIFFERENTIAL/PLATELET
Basophils Absolute: 0.1 10*3/uL (ref 0.0–0.1)
Basophils Relative: 0.7 % (ref 0.0–3.0)
Eosinophils Absolute: 0 10*3/uL (ref 0.0–0.7)
Eosinophils Relative: 0.2 % (ref 0.0–5.0)
HCT: 42.2 % (ref 36.0–46.0)
Hemoglobin: 14.4 g/dL (ref 12.0–15.0)
Lymphocytes Relative: 16.3 % (ref 12.0–46.0)
Lymphs Abs: 1.4 10*3/uL (ref 0.7–4.0)
MCHC: 34.2 g/dL (ref 30.0–36.0)
MCV: 95.2 fl (ref 78.0–100.0)
Monocytes Absolute: 0.6 10*3/uL (ref 0.1–1.0)
Monocytes Relative: 6.7 % (ref 3.0–12.0)
Neutro Abs: 6.7 10*3/uL (ref 1.4–7.7)
Neutrophils Relative %: 76.1 % (ref 43.0–77.0)
Platelets: 218 10*3/uL (ref 150.0–400.0)
RBC: 4.43 Mil/uL (ref 3.87–5.11)
RDW: 12.6 % (ref 11.5–15.5)
WBC: 8.9 10*3/uL (ref 4.0–10.5)

## 2022-11-09 LAB — COMPREHENSIVE METABOLIC PANEL
ALT: 13 U/L (ref 0–35)
AST: 18 U/L (ref 0–37)
Albumin: 4.6 g/dL (ref 3.5–5.2)
Alkaline Phosphatase: 44 U/L (ref 39–117)
BUN: 6 mg/dL (ref 6–23)
CO2: 28 mEq/L (ref 19–32)
Calcium: 9.3 mg/dL (ref 8.4–10.5)
Chloride: 103 mEq/L (ref 96–112)
Creatinine, Ser: 0.84 mg/dL (ref 0.40–1.20)
GFR: 84.67 mL/min (ref >60.00)
Glucose, Bld: 121 mg/dL — ABNORMAL HIGH (ref 70–99)
Potassium: 3.7 mEq/L (ref 3.5–5.1)
Sodium: 138 mEq/L (ref 135–145)
Total Bilirubin: 0.6 mg/dL (ref 0.2–1.2)
Total Protein: 6.4 g/dL (ref 6.0–8.3)

## 2022-11-09 LAB — LIPID PANEL
Cholesterol: 162 mg/dL (ref 0–200)
HDL: 45.3 mg/dL (ref >39.00)
LDL Cholesterol: 99 mg/dL (ref 0–99)
NonHDL: 116.73
Total CHOL/HDL Ratio: 4
Triglycerides: 88 mg/dL (ref 0.0–149.0)
VLDL: 17.6 mg/dL (ref 0.0–40.0)

## 2022-11-09 LAB — TSH: TSH: 1.35 u[IU]/mL (ref 0.35–5.50)

## 2022-11-20 ENCOUNTER — Encounter: Payer: Self-pay | Admitting: Internal Medicine

## 2022-11-21 ENCOUNTER — Encounter: Payer: 59 | Admitting: Internal Medicine

## 2022-12-05 ENCOUNTER — Telehealth: Payer: Self-pay

## 2022-12-05 NOTE — Telephone Encounter (Signed)
Patient has not responded to the message through My Chart about her appointment. Called the patient to discuss. No answer. Left the patient a message advising her appointment is not on 12/07/22 at 1:30 pm. Her appointment is 12/11/22 at 1:30 pm with Dr Lorenso Courier. I also asked she contact us back to acknowledge this message.

## 2022-12-05 NOTE — Telephone Encounter (Signed)
Called the patient to advise the appointment is 12/11/22 at 1:30 pm. No answer. Left another voicemail.

## 2022-12-06 NOTE — Telephone Encounter (Signed)
Called the patient. No answer. Did not leave another voicemail. Called the phone number listed for Sharee Pimple, mother of the patient. Female voice answering the phone said wrong number. Confirmed the number called. As [phone].

## 2022-12-11 ENCOUNTER — Encounter: Payer: 59 | Admitting: Internal Medicine

## 2022-12-21 ENCOUNTER — Encounter: Payer: Self-pay | Admitting: Internal Medicine

## 2022-12-21 ENCOUNTER — Ambulatory Visit: Payer: 59 | Admitting: Internal Medicine

## 2022-12-21 DIAGNOSIS — K5900 Constipation, unspecified: Secondary | ICD-10-CM | POA: Diagnosis not present

## 2022-12-21 DIAGNOSIS — K649 Unspecified hemorrhoids: Secondary | ICD-10-CM | POA: Diagnosis not present
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
## 2022-12-21 NOTE — Patient Instructions (Signed)
If you are age 45 or younger, your body mass index should be between 19-25. Your Body mass index is 22.26 kg/m. If this is out of the aformentioned range listed, please consider follow up with your Primary Care Provider.  ________________________________________________________  The [facility] providers would like to encourage you to use [facility] to communicate with providers for non-urgent requests or questions.  Due to long hold times on the telephone, sending your provider a message by [facility] may be a faster and more efficient way to get a response.  Please allow 48 business hours for a response.  Please remember that this is for non-urgent requests.  _______________________________________________________  We have referred you to Pelvic floor therapy. Someone from their office should contact you about an appointment.  You will need a follow up appointment in 3 months (April 2024).  Please contact our office to schedule this appointment.  Thank you for entrusting me with your care and choosing [facility].  Dr Lorenso Courier

## 2022-12-29 ENCOUNTER — Ambulatory Visit (INDEPENDENT_AMBULATORY_CARE_PROVIDER_SITE_OTHER): Payer: 59 | Admitting: Family Medicine

## 2022-12-29 ENCOUNTER — Encounter: Payer: Self-pay | Admitting: Family Medicine

## 2022-12-29 VITALS — BP 133/88 | HR 69 | Temp 98.1°F | Ht 67.0 in | Wt 136.0 lb

## 2022-12-29 DIAGNOSIS — Z8249 Family history of ischemic heart disease and other diseases of the circulatory system: Secondary | ICD-10-CM | POA: Diagnosis not present

## 2022-12-29 DIAGNOSIS — R0789 Other chest pain: Secondary | ICD-10-CM

## 2022-12-29 DIAGNOSIS — B349 Viral infection, unspecified: Secondary | ICD-10-CM

## 2022-12-29 DIAGNOSIS — J0100 Acute maxillary sinusitis, unspecified: Secondary | ICD-10-CM

## 2022-12-29 DIAGNOSIS — I10 Essential (primary) hypertension: Secondary | ICD-10-CM

## 2022-12-29 MED ORDER — AMOXICILLIN-POT CLAVULANATE 875-125 MG PO TABS: 1.0000 | ORAL_TABLET | Freq: Two times a day (BID) | ORAL | 0 refills | Status: AC

## 2022-12-29 NOTE — Patient Instructions (Signed)
Please follow up if symptoms do not improve or as needed.  Return if you are unable to return to your full workouts OR if you experience anymore chest pains after you have recovered from this illness.   Hydrate, rest and eat well.  Avoid caffeine or energy drinks.

## 2022-12-29 NOTE — Progress Notes (Signed)
Subjective  CC:  Chief Complaint  Patient presents with   Chest Pain    Pt states sx started 1x week ago.    Back Pain   Epistaxis    HPI: Lauren Matthews is a 47 y.o. female who presents to the office today to address the problems listed above in the chief complaint. About 1 1/2 weeks ago, started feeling poorly: tired, hard to get through workout at gym - more winded and faster heart rate, had to rest more. Had sinus pain, pressure and mucopurulent drainage associate with headaches. Tried to push through but then a few days ago developed subjective fever, myalgias, decreased appetite and frequent diarrhea w/o blood or mucous. Feeling poorly today. Mild cough. No sob at rest. Hasn't eaten in 2 days. No lightheadedness or exertional chest pain. Has had sharp chest pains intermittently. Has HTN and FH of CAD. No rash. Multiple sick contacts at work.   Assessment  1. Acute non-recurrent maxillary sinusitis   2. Viral syndrome   3. Essential hypertension   4. Family history of premature CAD   5. Atypical chest pain      Plan  Sinusitis + viral syndrome: treat with augmentin and supportive care. Likely dehydrated as well. Rest (no gym for a week), hydrated and work on oral intake. Immodium if needed.  Avoid energy drinks. Atypical cp: suspect related to illness; if recurs or persists after recovers, return for further eval. To ER for moderate or severe sxs Weight loss: ensure eating adequate calories for workouts. Hydrate.  Hypertension f/u: BP control is fairly well controlled. Continue meds.   Education regarding management of these chronic disease states was given. Management strategies discussed on successive visits include dietary and exercise recommendations, goals of achieving and maintaining IBW, and lifestyle modifications aiming for adequate sleep and minimizing stressors.   Follow up: prn  No orders of the defined types were placed in this encounter.  Meds ordered this  encounter  Medications   amoxicillin-clavulanate (AUGMENTIN) 875-125 MG tablet    Sig: Take 1 tablet by mouth 2 (two) times daily for 10 days.    Dispense:  20 tablet    Refill:  0      BP Readings from Last 3 Encounters:  12/29/22 133/88  12/21/22 118/76  11/08/22 130/70   Wt Readings from Last 3 Encounters:  12/29/22 136 lb (61.7 kg)  12/21/22 142 lb 2 oz (64.5 kg)  11/08/22 145 lb 3.2 oz (65.9 kg)    Lab Results  Component Value Date   CHOL 162 11/08/2022   CHOL 164 09/22/2021   CHOL 177 09/21/2020   Lab Results  Component Value Date   HDL 45.30 11/08/2022   HDL 63.80 09/22/2021   HDL 58 09/21/2020   Lab Results  Component Value Date   LDLCALC 99 11/08/2022   LDLCALC 87 09/22/2021   LDLCALC 99 09/21/2020   Lab Results  Component Value Date   TRIG 88.0 11/08/2022   TRIG 66.0 09/22/2021   TRIG 108 09/21/2020   Lab Results  Component Value Date   CHOLHDL 4 11/08/2022   CHOLHDL 3 09/22/2021   CHOLHDL 3.1 09/21/2020   No results found for: "LDLDIRECT" Lab Results  Component Value Date   CREATININE 0.84 11/08/2022   BUN 6 11/08/2022   NA 138 11/08/2022   K 3.7 11/08/2022   CL 103 11/08/2022   CO2 28 11/08/2022    The 10-year ASCVD risk score (Arnett DK, et al., 2019) is: 1%  Values used to calculate the score:     Age: 5 years     Sex: Female     Is Non-Hispanic African American: No     Diabetic: No     Tobacco smoker: No     Systolic Blood Pressure: 528 mmHg     Is BP treated: Yes     HDL Cholesterol: 45.3 mg/dL     Total Cholesterol: 162 mg/dL  I reviewed the patients updated PMH, FH, and SocHx.    Patient Active Problem List   Diagnosis Date Noted   Major depression, recurrent, chronic ([place]) 05/14/2020    Priority: High   Family history of premature CAD 05/14/2020    Priority: High   Essential hypertension     Priority: High   ADD (attention deficit disorder) 03/11/2013    Priority: High   Serrated polyp of colon 05/25/2022     Priority: Medium    Irritable bowel syndrome 05/14/2020    Priority: Medium    Primary insomnia 05/14/2020    Priority: Medium    History of DVT (deep vein thrombosis)     Priority: Medium    Heterozygous for prothrombin II gene mutation 03/11/2013    Priority: Medium    Migraine, unspecified, without mention of intractable migraine without mention of status migrainosus 03/11/2013    Priority: Medium    Cough due to ACE inhibitor 12/24/2020    Priority: Low   Fibrocystic breast changes, left 09/21/2020    Priority: Low    Allergies: Ace inhibitors  Social History: Patient  reports that she quit smoking about 4 years ago. Her smoking use included cigarettes. She has a 2.40 pack-year smoking history. She has never used smokeless tobacco. She reports current alcohol use. She reports that she does not use drugs.  Current Meds  Medication Sig   amoxicillin-clavulanate (AUGMENTIN) 875-125 MG tablet Take 1 tablet by mouth 2 (two) times daily for 10 days.   citalopram (CELEXA) 10 MG tablet TAKE 1 TABLET BY MOUTH EVERYDAY AT BEDTIME   linaclotide (LINZESS) 145 MCG CAPS capsule Take 1 capsule (145 mcg total) by mouth daily before breakfast.   losartan-hydrochlorothiazide (HYZAAR) 100-12.5 MG tablet TAKE 1 TABLET BY MOUTH EVERY DAY   traZODone (DESYREL) 50 MG tablet TAKE 1 TABLET BY MOUTH EVERYDAY AT BEDTIME    Review of Systems: Cardiovascular: negative for chest pain, palpitations, leg swelling, orthopnea Respiratory: negative for SOB, wheezing or persistent cough Gastrointestinal: negative for abdominal pain Genitourinary: negative for dysuria or gross hematuria  Objective  Vitals: BP 133/88 (BP Location: Right Arm, Patient Position: Sitting)   Pulse 69   Temp 98.1 F (36.7 C) (Temporal)   Ht '5\' 7"'$  (1.702 m)   Wt 136 lb (61.7 kg)   LMP 12/04/2022 (Approximate)   SpO2 99%   BMI 21.30 kg/m  General: no acute distress appears tired and thin Psych:  Alert and oriented, normal  mood and affect HEENT:  Normocephalic, atraumatic, supple neck + sinus ttp bilatearlly. Tms ok. Op ok. + lad Cardiovascular:  RRR without murmur. no edema Respiratory:  Good breath sounds bilaterally, CTAB with normal respiratory effort Gastrointestinal: soft, flat abdomen, normal active bowel sounds, no palpable masses, no hepatosplenomegaly, no appreciated hernias Skin:  Warm, no rashes Neurologic:   Mental status is normal [place] side effects, risks, benefits, and alternatives for medications and treatment plan prescribed today were discussed, and the patient expressed understanding of the given instructions. Patient is instructed to call or message via MyChart if  he/she has any questions or concerns regarding our treatment plan. No barriers to understanding were identified. We discussed Red Flag symptoms and signs in detail. Patient expressed understanding regarding what to do in case of urgent or emergency type symptoms.  Medication list was reconciled, printed and provided to the patient in AVS. Patient instructions and summary information was reviewed with the patient as documented in the AVS. This note was prepared with assistance of Dragon voice recognition software. Occasional wrong-word or sound-a-like substitutions may have occurred due to the inherent limitation

## 2023-01-02 ENCOUNTER — Telehealth: Payer: 59 | Admitting: Nurse Practitioner

## 2023-01-02 DIAGNOSIS — B3731 Acute candidiasis of vulva and vagina: Secondary | ICD-10-CM

## 2023-01-02 MED ORDER — FLUCONAZOLE 150 MG PO TABS: 150.0000 mg | ORAL_TABLET | Freq: Every day | ORAL | 0 refills | Status: AC

## 2023-01-02 NOTE — Progress Notes (Signed)
Virtual Visit Consent   [name], you are scheduled for a virtual visit with a [facility] provider today. Just as with appointments in the office, your consent must be obtained to participate. Your consent will be active for this visit and any virtual visit you may have with one of our providers in the next 365 days. If you have a MyChart account, a copy of this consent can be sent to you electronically.  As this is a virtual visit, video technology does not allow for your provider to perform a traditional examination. This may limit your provider's ability to fully assess your condition. If your provider identifies any concerns that need to be evaluated in person or the need to arrange testing (such as labs, EKG, etc.), we will make arrangements to do so. Although advances in technology are sophisticated, we cannot ensure that it will always work on either your end or our end. If the connection with a video visit is poor, the visit may have to be switched to a telephone visit. With either a video or telephone visit, we are not always able to ensure that we have a secure connection.  By engaging in this virtual visit, you consent to the provision of healthcare and authorize for your insurance to be billed (if applicable) for the services provided during this visit. Depending on your insurance coverage, you may receive a charge related to this service.  I need to obtain your verbal consent now. Are you willing to proceed with your visit today? Lauren Matthews has provided verbal consent on 01/02/2023 for a virtual visit (video or telephone). Apolonio Schneiders, FNP  Date: 01/02/2023 9:05 AM  Virtual Visit via Video Note   I, Apolonio Schneiders, connected with  Lauren Matthews  ([id number], 1976/06/28) on 01/02/23 at  9:15 AM EST by a video-enabled telemedicine application and verified that I am speaking with the correct person using two identifiers.  Location: Patient: Virtual Visit Location Patient:  Home Provider: Virtual Visit Location Provider: Home Office   I discussed the limitations of evaluation and management by telemedicine and the availability of in person appointments. The patient expressed understanding and agreed to proceed.    History of Present Illness: Lauren Matthews is a 47 y.o. who identifies as a female who was assigned female at birth, and is being seen today for concerns about.  Last night during intercourse she was noted to have discharge without odor  Denies vaginal itching or notable discharge throughout the day.   She is currently on Amoxicillin day 4/10  Problems:  Patient Active Problem List   Diagnosis Date Noted   Serrated polyp of colon 05/25/2022   Cough due to ACE inhibitor 12/24/2020   Fibrocystic breast changes, left 09/21/2020   Major depression, recurrent, chronic ([place]) 05/14/2020   Family history of premature CAD 05/14/2020   Irritable bowel syndrome 05/14/2020   Primary insomnia 05/14/2020   Essential hypertension    History of DVT (deep vein thrombosis)    Heterozygous for prothrombin II gene mutation 03/11/2013   ADD (attention deficit disorder) 03/11/2013   Migraine, unspecified, without mention of intractable migraine without mention of status migrainosus 03/11/2013    Allergies:  Allergies  Allergen Reactions   Ace Inhibitors Cough   Medications:  Current Outpatient Medications:    amoxicillin-clavulanate (AUGMENTIN) 875-125 MG tablet, Take 1 tablet by mouth 2 (two) times daily for 10 days., Disp: 20 tablet, Rfl: 0   citalopram (CELEXA) 10 MG tablet, TAKE  1 TABLET BY MOUTH EVERYDAY AT BEDTIME, Disp: 90 tablet, Rfl: 3   linaclotide (LINZESS) 145 MCG CAPS capsule, Take 1 capsule (145 mcg total) by mouth daily before breakfast., Disp: 30 capsule, Rfl: 3   losartan-hydrochlorothiazide (HYZAAR) 100-12.5 MG tablet, TAKE 1 TABLET BY MOUTH EVERY DAY, Disp: 90 tablet, Rfl: 3   traZODone (DESYREL) 50 MG tablet, TAKE 1 TABLET BY MOUTH  EVERYDAY AT BEDTIME, Disp: 90 tablet, Rfl: 3  Observations/Objective: Patient is well-developed, well-nourished in no acute distress.  Resting comfortably  at home.  Head is normocephalic, atraumatic.  No labored breathing.  Speech is clear and coherent with logical content.  Patient is alert and oriented at baseline.    Assessment and Plan: 1. Vaginal yeast infection  - fluconazole (DIFLUCAN) 150 MG tablet; Take 1 tablet (150 mg total) by mouth daily for 1 day.  Dispense: 1 tablet; Refill: 0    Follow Up Instructions: I discussed the assessment and treatment plan with the patient. The patient was provided an opportunity to ask questions and all were answered. The patient agreed with the plan and demonstrated an understanding of the instructions.  A copy of instructions were sent to the patient via MyChart unless otherwise noted below.    The patient was advised to call back or seek an in-person evaluation if the symptoms worsen or if the condition fails to improve as anticipated.  Time:  I spent 10 minutes with the patient via telehealth technology discussing the above problems/concerns.    Apolonio Schneiders, FNP

## 2023-01-08 ENCOUNTER — Encounter: Payer: Self-pay | Admitting: Family Medicine

## 2023-01-11 ENCOUNTER — Ambulatory Visit (INDEPENDENT_AMBULATORY_CARE_PROVIDER_SITE_OTHER)
Admission: RE | Admit: 2023-01-11 | Discharge: 2023-01-11 | Disposition: A | Discharge status: Home / Self Care | Payer: 59 | Source: Ambulatory Visit | Attending: Family Medicine | Admitting: Family Medicine

## 2023-01-11 ENCOUNTER — Ambulatory Visit: Payer: 59 | Admitting: Family Medicine

## 2023-01-11 ENCOUNTER — Encounter: Payer: Self-pay | Admitting: Family Medicine

## 2023-01-11 VITALS — BP 146/92 | HR 67 | Temp 98.2°F | Ht 67.0 in | Wt 137.4 lb

## 2023-01-11 DIAGNOSIS — I10 Essential (primary) hypertension: Secondary | ICD-10-CM | POA: Diagnosis not present

## 2023-01-11 DIAGNOSIS — N6342 Unspecified lump in left breast, subareolar: Secondary | ICD-10-CM | POA: Diagnosis not present

## 2023-01-11 DIAGNOSIS — Z8249 Family history of ischemic heart disease and other diseases of the circulatory system: Secondary | ICD-10-CM | POA: Diagnosis not present

## 2023-01-11 DIAGNOSIS — R0789 Other chest pain: Secondary | ICD-10-CM | POA: Diagnosis not present

## 2023-01-11 DIAGNOSIS — R634 Abnormal weight loss: Secondary | ICD-10-CM

## 2023-01-11 LAB — COMPREHENSIVE METABOLIC PANEL
ALT: 15 U/L (ref 0–35)
AST: 19 U/L (ref 0–37)
Albumin: 4.6 g/dL (ref 3.5–5.2)
Alkaline Phosphatase: 60 U/L (ref 39–117)
BUN: 11 mg/dL (ref 6–23)
CO2: 26 mEq/L (ref 19–32)
Calcium: 9.5 mg/dL (ref 8.4–10.5)
Chloride: 101 mEq/L (ref 96–112)
Creatinine, Ser: 0.76 mg/dL (ref 0.40–1.20)
GFR: 95.36 mL/min (ref >60.00)
Glucose, Bld: 97 mg/dL (ref 70–99)
Potassium: 3.8 mEq/L (ref 3.5–5.1)
Sodium: 139 mEq/L (ref 135–145)
Total Bilirubin: 0.6 mg/dL (ref 0.2–1.2)
Total Protein: 6.6 g/dL (ref 6.0–8.3)

## 2023-01-11 LAB — CBC WITH DIFFERENTIAL/PLATELET
Basophils Absolute: 0 10*3/uL (ref 0.0–0.1)
Basophils Relative: 0.3 % (ref 0.0–3.0)
Eosinophils Absolute: 0 10*3/uL (ref 0.0–0.7)
Eosinophils Relative: 0.2 % (ref 0.0–5.0)
HCT: 42.8 % (ref 36.0–46.0)
Hemoglobin: 14.6 g/dL (ref 12.0–15.0)
Lymphocytes Relative: 14.5 % (ref 12.0–46.0)
Lymphs Abs: 1.4 10*3/uL (ref 0.7–4.0)
MCHC: 34 g/dL (ref 30.0–36.0)
MCV: 92.6 fl (ref 78.0–100.0)
Monocytes Absolute: 0.8 10*3/uL (ref 0.1–1.0)
Monocytes Relative: 8.1 % (ref 3.0–12.0)
Neutro Abs: 7.6 10*3/uL (ref 1.4–7.7)
Neutrophils Relative %: 76.9 % (ref 43.0–77.0)
Platelets: 267 10*3/uL (ref 150.0–400.0)
RBC: 4.62 Mil/uL (ref 3.87–5.11)
RDW: 12.6 % (ref 11.5–15.5)
WBC: 9.9 10*3/uL (ref 4.0–10.5)

## 2023-01-11 LAB — TSH: TSH: 1.74 u[IU]/mL (ref 0.35–5.50)

## 2023-01-11 LAB — SEDIMENTATION RATE: Sed Rate: 10 mm/hr (ref 0–20)

## 2023-01-11 LAB — CK: Total CK: 299 U/L — ABNORMAL HIGH (ref 7–177)

## 2023-01-11 NOTE — Progress Notes (Signed)
Subjective  CC:   Chief Complaint  Patient presents with   Back Pain   Cough    HPI: Lauren Matthews is a 47 y.o. female who presents to the office today to address the problems listed above in the chief complaint. 47 year old with history of hypertension and family history of coronary disease presents for follow-up.  She is seen recently for viral syndrome and sinusitis treated with Augmentin.  She was having some GI symptoms including diarrhea.  No symptoms are much improved.  She does admit to persistent PND but no longer with thick mucopurulent discharge or sinus pressure/pain.  No more fevers.  Bowels have settled down.  She is eating.  She has returned to the gym but feels fatigued and unable to complete her workouts like she had before.  She feels short of breath at times.  She describes intermittent thoracic back pain without a clear pattern.  No chest pain or pleuritic chest pain.  She just does not feel herself. Also noted a left breast mass.  Nontender.  Last mammogram 2020.  No family history of breast cancer. Wt Readings from Last 3 Encounters:  01/11/23 137 lb 6.4 oz (62.3 kg)  12/29/22 136 lb (61.7 kg)  12/21/22 142 lb 2 oz (64.5 kg)   BP Readings from Last 3 Encounters:  01/11/23 (!) 146/92  12/29/22 133/88  12/21/22 118/76    Assessment  1. Atypical chest pain   2. Family history of premature CAD   3. Essential hypertension   4. Subareolar mass of left breast   5. Weight loss      Plan  Atypical chest pain: Unclear etiology.  Check EKG and chest x-ray.  Unremarkable exam today.  Blood pressure is mildly elevated but she is just not feeling well.  Will follow check lab work Weight loss: Check prealbumin, sed rate and TSH. Monitor blood pressure. Refer for diagnostic mammogram.  Follow up: To be determined after workup results back Visit date not found  Orders Placed This Encounter  Procedures   DG Chest 2 View   MM Digital Diagnostic Bilat   CK    Comprehensive metabolic panel   CBC with Differential/Platelet   TSH   Prealbumin   Sedimentation rate   EKG 12-Lead   No orders of the defined types were placed in this encounter.     I reviewed the patients updated PMH, FH, and SocHx.    Patient Active Problem List   Diagnosis Date Noted   Major depression, recurrent, chronic ([place]) 05/14/2020    Priority: High   Family history of premature CAD 05/14/2020    Priority: High   Essential hypertension     Priority: High   ADD (attention deficit disorder) 03/11/2013    Priority: High   Serrated polyp of colon 05/25/2022    Priority: Medium    Irritable bowel syndrome 05/14/2020    Priority: Medium    Primary insomnia 05/14/2020    Priority: Medium    History of DVT (deep vein thrombosis)     Priority: Medium    Heterozygous for prothrombin II gene mutation 03/11/2013    Priority: Medium    Migraine, unspecified, without mention of intractable migraine without mention of status migrainosus 03/11/2013    Priority: Medium    Cough due to ACE inhibitor 12/24/2020    Priority: Low   Fibrocystic breast changes, left 09/21/2020    Priority: Low   Current Meds  Medication Sig   citalopram (CELEXA) 10  MG tablet TAKE 1 TABLET BY MOUTH EVERYDAY AT BEDTIME   linaclotide (LINZESS) 145 MCG CAPS capsule Take 1 capsule (145 mcg total) by mouth daily before breakfast.   losartan-hydrochlorothiazide (HYZAAR) 100-12.5 MG tablet TAKE 1 TABLET BY MOUTH EVERY DAY   traZODone (DESYREL) 50 MG tablet TAKE 1 TABLET BY MOUTH EVERYDAY AT BEDTIME    Allergies: Patient is allergic to ace inhibitors. Family History: Patient family history includes Bleeding Disorder in her father and sister; Breast cancer in her paternal grandmother; Colon polyps in her maternal uncle; Deep vein thrombosis in her father and sister; Diabetes in her paternal grandfather; Factor V Leiden deficiency in her father; Heart disease in her paternal aunt and paternal uncle;  Heart failure in her maternal grandfather; Hypertension in her maternal grandmother and mother; Obesity in her sister; Other in her sister; Protein S deficiency in her father and sister; Pulmonary embolism in her father; Skin cancer in her father. Social History:  Patient  reports that she quit smoking about 4 years ago. Her smoking use included cigarettes. She has a 2.40 pack-year smoking history. She has never used smokeless tobacco. She reports current alcohol use. She reports that she does not use drugs.  Review of Systems: Constitutional: Negative for fever malaise or anorexia Cardiovascular: negative for chest pain Respiratory: negative for SOB or persistent cough Gastrointestinal: negative for abdominal pain  Objective  Vitals: BP (!) 146/92   Pulse 67   Temp 98.2 F (36.8 C)   Ht 5' 7"$  (1.702 m)   Wt 137 lb 6.4 oz (62.3 kg)   LMP 12/04/2022 (Approximate)   SpO2 98%   BMI 21.52 kg/m  General: no acute distress , A&Ox3, appears well HEENT: PEERL, conjunctiva normal, neck is supple, TMs are clear, oropharynx is clear.  No lymphadenopathy.  Nasal mucosa is clear Cardiovascular:  RRR without murmur or gallop.  Respiratory:  Good breath sounds bilaterally, CTAB with normal respiratory effort No back tenderness or chest wall tenderness Benign abdomen Skin:  Warm, no rashes  Commons side effects, risks, benefits, and alternatives for medications and treatment plan prescribed today were discussed, and the patient expressed understanding of the given instructions. Patient is instructed to call or message via MyChart if he/she has any questions or concerns regarding our treatment plan. No barriers to understanding were identified. We discussed Red Flag symptoms and signs in detail. Patient expressed understanding regarding what to do in case of urgent or emergency type symptoms.  Medication list was reconciled, printed and provided to the patient in AVS. Patient instructions and summary  information was reviewed with the patient as documented in the AVS. This note was prepared with assistance of Dragon voice recognition software. Occasional wrong-word or sound-a-like substitutions may have occurred due to the inherent limitations of voice recognition software

## 2023-01-11 NOTE — Patient Instructions (Addendum)
Please follow up if symptoms do not improve or as needed.    I will release your lab results to you on your MyChart account with further instructions. You may see the results before I do, but when I review them I will send you a message with my report or have my assistant call you if things need to be discussed. Please reply to my message with any questions. Thank you!   I have ordered a mammogram and/or bone density for you as we discussed today: '[]'$   Mammogram  '[x]'$   Bone Density  Please call the office checked below to schedule your appointment:  '[x]'$   [facility]      [address] [place], [place]         '[]'$   [facility]  [address] [place], [place]

## 2023-01-12 ENCOUNTER — Encounter: Payer: Self-pay | Admitting: Family Medicine

## 2023-01-12 ENCOUNTER — Other Ambulatory Visit: Payer: Self-pay | Admitting: Family Medicine

## 2023-01-12 DIAGNOSIS — N6342 Unspecified lump in left breast, subareolar: Secondary | ICD-10-CM

## 2023-01-12 LAB — PREALBUMIN: Prealbumin: 32 mg/dL (ref 17–34)

## 2023-01-14 ENCOUNTER — Other Ambulatory Visit: Payer: Self-pay | Admitting: Internal Medicine

## 2023-01-15 ENCOUNTER — Ambulatory Visit
Admission: RE | Admit: 2023-01-15 | Discharge: 2023-01-15 | Disposition: A | Discharge status: Home / Self Care | Payer: 59 | Source: Ambulatory Visit | Attending: Family Medicine | Admitting: Family Medicine

## 2023-01-15 ENCOUNTER — Other Ambulatory Visit: Payer: Self-pay | Admitting: Family Medicine

## 2023-01-15 DIAGNOSIS — N6342 Unspecified lump in left breast, subareolar: Secondary | ICD-10-CM

## 2023-01-19 ENCOUNTER — Other Ambulatory Visit: Payer: Self-pay | Admitting: Family Medicine

## 2023-01-19 ENCOUNTER — Ambulatory Visit
Admission: RE | Admit: 2023-01-19 | Discharge: 2023-01-19 | Disposition: A | Discharge status: Home / Self Care | Payer: 59 | Source: Ambulatory Visit | Attending: Family Medicine | Admitting: Family Medicine

## 2023-01-19 DIAGNOSIS — N6342 Unspecified lump in left breast, subareolar: Secondary | ICD-10-CM

## 2023-01-29 ENCOUNTER — Other Ambulatory Visit: Payer: Self-pay

## 2023-01-29 ENCOUNTER — Encounter: Payer: Self-pay | Admitting: Physical Therapy

## 2023-01-29 ENCOUNTER — Ambulatory Visit: Payer: 59 | Attending: Internal Medicine | Admitting: Physical Therapy

## 2023-01-29 DIAGNOSIS — R279 Unspecified lack of coordination: Secondary | ICD-10-CM | POA: Diagnosis present

## 2023-01-29 DIAGNOSIS — R293 Abnormal posture: Secondary | ICD-10-CM | POA: Insufficient documentation

## 2023-01-29 DIAGNOSIS — M62838 Other muscle spasm: Secondary | ICD-10-CM | POA: Diagnosis present
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
## 2023-02-09 ENCOUNTER — Ambulatory Visit: Payer: 59 | Attending: Internal Medicine | Admitting: Physical Therapy

## 2023-02-09 DIAGNOSIS — M62838 Other muscle spasm: Secondary | ICD-10-CM | POA: Diagnosis not present

## 2023-02-09 DIAGNOSIS — R293 Abnormal posture: Secondary | ICD-10-CM | POA: Diagnosis present

## 2023-02-09 DIAGNOSIS — R279 Unspecified lack of coordination: Secondary | ICD-10-CM | POA: Diagnosis present

## 2023-02-15 ENCOUNTER — Ambulatory Visit: Payer: 59 | Admitting: Physical Therapy

## 2023-02-19 ENCOUNTER — Ambulatory Visit: Payer: 59 | Admitting: Physical Therapy

## 2023-02-26 ENCOUNTER — Encounter: Payer: Self-pay | Admitting: Physical Therapy

## 2023-02-26 ENCOUNTER — Ambulatory Visit: Payer: 59 | Admitting: Physical Therapy

## 2023-02-26 DIAGNOSIS — M62838 Other muscle spasm: Secondary | ICD-10-CM | POA: Diagnosis not present

## 2023-02-26 DIAGNOSIS — R279 Unspecified lack of coordination: Secondary | ICD-10-CM

## 2023-02-26 DIAGNOSIS — R293 Abnormal posture: Secondary | ICD-10-CM
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
## 2023-03-13 ENCOUNTER — Ambulatory Visit: Payer: 59 | Admitting: Physical Therapy
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
## 2023-03-27 ENCOUNTER — Encounter: Payer: Self-pay | Admitting: Internal Medicine

## 2023-03-27 ENCOUNTER — Encounter: Payer: Self-pay | Admitting: Physical Therapy

## 2023-03-27 ENCOUNTER — Ambulatory Visit: Payer: 59 | Attending: Internal Medicine | Admitting: Physical Therapy

## 2023-03-27 DIAGNOSIS — R279 Unspecified lack of coordination: Secondary | ICD-10-CM | POA: Insufficient documentation

## 2023-03-27 DIAGNOSIS — R293 Abnormal posture: Secondary | ICD-10-CM | POA: Diagnosis present

## 2023-03-27 DIAGNOSIS — M62838 Other muscle spasm: Secondary | ICD-10-CM | POA: Diagnosis present
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
## 2023-03-28 ENCOUNTER — Encounter: Payer: Self-pay | Admitting: Physical Therapy

## 2023-04-03 ENCOUNTER — Ambulatory Visit: Payer: 59 | Admitting: Physical Therapy

## 2023-04-03 ENCOUNTER — Encounter: Payer: Self-pay | Admitting: Physical Therapy

## 2023-04-03 DIAGNOSIS — M62838 Other muscle spasm: Secondary | ICD-10-CM | POA: Diagnosis not present

## 2023-04-03 DIAGNOSIS — R293 Abnormal posture: Secondary | ICD-10-CM

## 2023-04-03 DIAGNOSIS — R279 Unspecified lack of coordination: Secondary | ICD-10-CM
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
## 2023-04-10 ENCOUNTER — Ambulatory Visit: Payer: 59 | Admitting: Physical Therapy

## 2023-04-24 ENCOUNTER — Ambulatory Visit: Payer: Self-pay | Admitting: Surgery

## 2023-06-01 ENCOUNTER — Ambulatory Visit: Payer: 59 | Admitting: Family Medicine

## 2023-08-14 ENCOUNTER — Ambulatory Visit
Admission: RE | Admit: 2023-08-14 | Discharge: 2023-08-14 | Disposition: A | Discharge status: Home / Self Care | Payer: 59 | Source: Ambulatory Visit | Attending: Family Medicine | Admitting: Family Medicine

## 2023-08-14 DIAGNOSIS — N6342 Unspecified lump in left breast, subareolar: Secondary | ICD-10-CM

## 2023-10-01 ENCOUNTER — Ambulatory Visit: Payer: 59 | Admitting: Family Medicine

## 2023-10-01 ENCOUNTER — Encounter: Payer: Self-pay | Admitting: Family Medicine

## 2023-10-01 VITALS — BP 120/84 | HR 62 | Temp 97.6°F | Ht 67.0 in | Wt 138.6 lb

## 2023-10-01 DIAGNOSIS — L239 Allergic contact dermatitis, unspecified cause: Secondary | ICD-10-CM | POA: Diagnosis not present

## 2023-10-01 DIAGNOSIS — I10 Essential (primary) hypertension: Secondary | ICD-10-CM

## 2023-10-01 DIAGNOSIS — N951 Menopausal and female climacteric states: Secondary | ICD-10-CM | POA: Diagnosis not present

## 2023-10-01 MED ORDER — LOSARTAN POTASSIUM-HCTZ 100-25 MG PO TABS: 1.0000 | ORAL_TABLET | Freq: Every day | ORAL | 3 refills | Status: DC

## 2023-10-01 NOTE — Patient Instructions (Signed)
Please follow up as scheduled for your next visit with me: 11/14/2023   If you have any questions or concerns, please don't hesitate to send me a message via MyChart or call the office at [phone]. Thank you for visiting with [place] today! It's our pleasure caring for you.   Perimenopause Perimenopause is the normal time of a woman's life when the levels of estrogen, the female hormone produced by the ovaries, begin to decrease. This leads to changes in menstrual periods before they stop completely (menopause). Perimenopause can begin 2-8 years before menopause. During perimenopause, the ovaries may or may not produce an egg and a woman can still become pregnant. What are the causes? This condition is caused by a natural change in hormone levels that happens as you get older. What increases the risk? This condition is more likely to start at an earlier age if you have certain medical conditions or have undergone treatments, including: A tumor of the pituitary gland in the brain. A disease that affects the ovaries and hormone production. Certain cancer treatments, such as chemotherapy or hormone therapy, or radiation therapy on the pelvis. Heavy smoking and excessive alcohol use. Family history of early menopause. What are the signs or symptoms? Perimenopausal changes affect each woman differently. Symptoms of this condition may include: Hot flashes. Irregular menstrual periods. Night sweats. Changes in feelings about sex. This could be a decrease in sex drive or an increased discomfort around your sexuality. Vaginal dryness. Headaches. Mood swings. Depression. Problems sleeping (insomnia). Memory problems or trouble concentrating. Irritability. Tiredness. Weight gain. Anxiety. Trouble getting pregnant. How is this diagnosed? This condition is diagnosed based on your medical history, a physical exam, your age, your menstrual history, and your symptoms. Hormone tests may also be  done. How is this treated? In some cases, no treatment is needed. You and your health care provider should make a decision together about whether treatment is necessary. Treatment will be based on your individual condition and preferences. Various treatments are available, such as: Menopausal hormone therapy (MHT). Medicines to treat specific symptoms. Acupuncture. Vitamin or herbal supplements. Before starting treatment, make sure to let your health care provider know if you have a personal or family history of: Heart disease. Breast cancer. Blood clots. Diabetes. Osteoporosis. Follow these instructions at home: Medicines Take over-the-counter and prescription medicines only as told by your health care provider. Take vitamin supplements only as told by your health care provider. Talk with your health care provider before starting any herbal supplements. Lifestyle  Do not use any products that contain nicotine or tobacco, such as cigarettes, e-cigarettes, and chewing tobacco. If you need help quitting, ask your health care provider. Get at least 30 minutes of physical activity on 5 or more days each week. Eat a balanced diet that includes fresh fruits and vegetables, whole grains, soybeans, eggs, lean meat, and low-fat dairy. Avoid alcoholic and caffeinated beverages, as well as spicy foods. This may help prevent hot flashes. Get 7-8 hours of sleep each night. Dress in layers that can be removed to help you manage hot flashes. Find ways to manage stress, such as deep breathing, meditation, or journaling. General instructions  Keep track of your menstrual periods, including: When they occur. How heavy they are and how long they last. How much time passes between periods. Keep track of your symptoms, noting when they start, how often you have them, and how long they last. Use vaginal lubricants or moisturizers to help with vaginal dryness and improve  comfort during sex. You can still  become pregnant if you are having irregular periods. Make sure you use contraception during perimenopause if you do not want to get pregnant. Keep all follow-up visits. This is important. This includes any group therapy or counseling. Contact a health care provider if: You have heavy vaginal bleeding or pass blood clots. Your period lasts more than 2 days longer than normal. Your periods are recurring sooner than 21 days. You bleed after having sex. You have pain during sex. Get help right away if you have: Chest pain, trouble breathing, or trouble talking. Severe depression. Pain when you urinate. Severe headaches. Vision problems. Summary Perimenopause is the time when a woman's body begins to move into menopause. This may happen naturally or as a result of other health problems or medical treatments. Perimenopause can begin 2-8 years before menopause, and it can last for several years. Perimenopausal symptoms can be managed through medicines, lifestyle changes, and complementary therapies such as acupuncture. This information is not intended to replace advice given to you by your health care provider. Make sure you discuss any questions you have with your health care provider. Document Revised: 05/06/2020 Document Reviewed: 05/06/2020 Elsevier Patient Education  2024 [employer].

## 2023-10-01 NOTE — Progress Notes (Signed)
Subjective  CC:  Chief Complaint  Patient presents with   Hypertension   Menopause    HPI: Lauren Matthews is a 47 y.o. female who presents to the office today to address the problems listed above in the chief complaint. 47 year old with some changes that she thinks could be related to perimenopause: Changes of menstrual cycle, increased cramping and some dark heavy bleeding without discharge.  Changes in skin and some mild hair loss, diffuse.  More emotional occasional irregular cycle, some skin changes.  Status post tubal ligation.  No need for oral contraceptives.  No weight changes. Hypertension: Does not check at home.  Diastolics have been running high here at the office.  On Hyzaar 100/12.5 daily.  Compliant with medication.  Exercises and healthy diet Complains of itchy rash on mid back, right itchy ear, dry skin.  No systemic symptoms, fevers.  No hives or vesicles.  No pain  Assessment  1. Perimenopause   2. Essential hypertension   3. Allergic dermatitis      Plan  Perimenopause: Education counseling given.  Support symptoms.  Monitor.  Let me know if bleeding or pelvic pain worsens.  Discussed emotional changes that are typical.  She will follow-up if things worsen as well Hypertension: Moderate control.  Increase Hyzaar dosing to 100/25 daily.  Education given. Possible allergic dermatitis: Start Zyrtec 10 nightly and Benadryl cream if needed  Follow up: As scheduled for complete physical 11/14/2023  No orders of the defined types were placed in this encounter.  Meds ordered this encounter  Medications   losartan-hydrochlorothiazide (HYZAAR) 100-25 MG tablet    Sig: Take 1 tablet by mouth daily.    Dispense:  90 tablet    Refill:  3      I reviewed the patients updated PMH, FH, and SocHx.    Patient Active Problem List   Diagnosis Date Noted   Major depression, recurrent, chronic (HCC) 05/14/2020    Priority: High   Family history of premature CAD 05/14/2020     Priority: High   Essential hypertension     Priority: High   ADD (attention deficit disorder) 03/11/2013    Priority: High   Serrated polyp of colon 05/25/2022    Priority: Medium    Irritable bowel syndrome 05/14/2020    Priority: Medium    Primary insomnia 05/14/2020    Priority: Medium    History of DVT (deep vein thrombosis)     Priority: Medium    Heterozygous for prothrombin II gene mutation 03/11/2013    Priority: Medium    Migraine headache 03/11/2013    Priority: Medium    Cough due to ACE inhibitor 12/24/2020    Priority: Low   Fibrocystic breast changes, left 09/21/2020    Priority: Low   Current Meds  Medication Sig   citalopram (CELEXA) 10 MG tablet TAKE 1 TABLET BY MOUTH EVERYDAY AT BEDTIME   linaclotide (LINZESS) 145 MCG CAPS capsule Take 1 capsule (145 mcg total) by mouth daily before breakfast.   losartan-hydrochlorothiazide (HYZAAR) 100-25 MG tablet Take 1 tablet by mouth daily.   traZODone (DESYREL) 50 MG tablet TAKE 1 TABLET BY MOUTH EVERYDAY AT BEDTIME   [DISCONTINUED] losartan-hydrochlorothiazide (HYZAAR) 100-12.5 MG tablet TAKE 1 TABLET BY MOUTH EVERY DAY    Allergies: Patient is allergic to ace inhibitors. Family History: Patient family history includes Bleeding Disorder in her father and sister; Breast cancer in her paternal grandmother; Colon polyps in her maternal uncle; Deep vein thrombosis in her  father and sister; Diabetes in her paternal grandfather; Factor V Leiden deficiency in her father; Heart disease in her paternal aunt and paternal uncle; Heart failure in her maternal grandfather; Hypertension in her maternal grandmother and mother; Obesity in her sister; Other in her sister; Prostate cancer in her paternal uncle; Protein S deficiency in her father and sister; Pulmonary embolism in her father; Skin cancer in her father. Social History:  Patient  reports that she quit smoking about 4 years ago. Her smoking use included cigarettes. She  started smoking about 25 years ago. She has a 2.4 pack-year smoking history. She has never used smokeless tobacco. She reports current alcohol use. She reports that she does not use drugs.  Review of Systems: Constitutional: Negative for fever malaise or anorexia Cardiovascular: negative for chest pain Respiratory: negative for SOB or persistent cough Gastrointestinal: negative for abdominal pain  Objective  Vitals: BP 120/84   Pulse 62   Temp 97.6 F (36.4 C)   Ht 5\' 7"  (1.702 m)   Wt 138 lb 9.6 oz (62.9 kg)   SpO2 99%   BMI 21.71 kg/m  General: no acute distress , A&Ox3 HEENT: PEERL, conjunctiva normal, neck is supple, right TM is normal Cardiovascular:  RRR without murmur or gallop.  Respiratory:  Good breath sounds bilaterally, CTAB with normal respiratory effort Skin:  Warm, back with a few excoriated scabs.  No rash  Commons side effects, risks, benefits, and alternatives for medications and treatment plan prescribed today were discussed, and the patient expressed understanding of the given instructions. Patient is instructed to call or message via MyChart if he/she has any questions or concerns regarding our treatment plan. No barriers to understanding were identified. We discussed Red Flag symptoms and signs in detail. Patient expressed understanding regarding what to do in case of urgent or emergency type symptoms.  Medication list was reconciled, printed and provided to the patient in AVS. Patient instructions and summary information was reviewed with the patient as documented in the AVS. This note was prepared with assistance of Dragon voice recognition software. Occasional wrong-word or sound-a-like substitutions may have occurred due to the inherent limitations of voice recognition software

## 2023-11-14 ENCOUNTER — Other Ambulatory Visit: Payer: Self-pay | Admitting: Family Medicine

## 2023-11-14 ENCOUNTER — Encounter: Payer: Self-pay | Admitting: Family Medicine

## 2023-11-14 ENCOUNTER — Ambulatory Visit: Payer: 59 | Admitting: Family Medicine

## 2023-11-14 VITALS — BP 138/88 | HR 61 | Temp 97.6°F | Ht 67.0 in | Wt 142.6 lb

## 2023-11-14 DIAGNOSIS — I10 Essential (primary) hypertension: Secondary | ICD-10-CM

## 2023-11-14 DIAGNOSIS — F5101 Primary insomnia: Secondary | ICD-10-CM | POA: Diagnosis not present

## 2023-11-14 DIAGNOSIS — K635 Polyp of colon: Secondary | ICD-10-CM

## 2023-11-14 DIAGNOSIS — F339 Major depressive disorder, recurrent, unspecified: Secondary | ICD-10-CM

## 2023-11-14 DIAGNOSIS — R748 Abnormal levels of other serum enzymes: Secondary | ICD-10-CM | POA: Diagnosis not present

## 2023-11-14 DIAGNOSIS — D6852 Prothrombin gene mutation: Secondary | ICD-10-CM

## 2023-11-14 DIAGNOSIS — Z0001 Encounter for general adult medical examination with abnormal findings: Secondary | ICD-10-CM | POA: Diagnosis not present

## 2023-11-14 DIAGNOSIS — K581 Irritable bowel syndrome with constipation: Secondary | ICD-10-CM

## 2023-11-14 LAB — LIPID PANEL
Cholesterol: 131 mg/dL (ref 0–200)
HDL: 49.5 mg/dL (ref >39.00)
LDL Cholesterol: 70 mg/dL (ref 0–99)
NonHDL: 81.28
Total CHOL/HDL Ratio: 3
Triglycerides: 57 mg/dL (ref 0.0–149.0)
VLDL: 11.4 mg/dL (ref 0.0–40.0)

## 2023-11-14 LAB — COMPREHENSIVE METABOLIC PANEL
ALT: 15 U/L (ref 0–35)
AST: 19 U/L (ref 0–37)
Albumin: 4.3 g/dL (ref 3.5–5.2)
Alkaline Phosphatase: 45 U/L (ref 39–117)
BUN: 13 mg/dL (ref 6–23)
CO2: 26 meq/L (ref 19–32)
Calcium: 9 mg/dL (ref 8.4–10.5)
Chloride: 104 meq/L (ref 96–112)
Creatinine, Ser: 0.86 mg/dL (ref 0.40–1.20)
GFR: 81.73 mL/min (ref >60.00)
Glucose, Bld: 88 mg/dL (ref 70–99)
Potassium: 4.1 meq/L (ref 3.5–5.1)
Sodium: 136 meq/L (ref 135–145)
Total Bilirubin: 0.7 mg/dL (ref 0.2–1.2)
Total Protein: 6.2 g/dL (ref 6.0–8.3)

## 2023-11-14 LAB — CBC WITH DIFFERENTIAL/PLATELET
Basophils Absolute: 0 10*3/uL (ref 0.0–0.1)
Basophils Relative: 0.3 % (ref 0.0–3.0)
Eosinophils Absolute: 0.1 10*3/uL (ref 0.0–0.7)
Eosinophils Relative: 0.8 % (ref 0.0–5.0)
HCT: 41.7 % (ref 36.0–46.0)
Hemoglobin: 14.2 g/dL (ref 12.0–15.0)
Lymphocytes Relative: 17.6 % (ref 12.0–46.0)
Lymphs Abs: 1.4 10*3/uL (ref 0.7–4.0)
MCHC: 34 g/dL (ref 30.0–36.0)
MCV: 96.9 fL (ref 78.0–100.0)
Monocytes Absolute: 0.7 10*3/uL (ref 0.1–1.0)
Monocytes Relative: 8.6 % (ref 3.0–12.0)
Neutro Abs: 5.6 10*3/uL (ref 1.4–7.7)
Neutrophils Relative %: 72.7 % (ref 43.0–77.0)
Platelets: 176 10*3/uL (ref 150.0–400.0)
RBC: 4.3 Mil/uL (ref 3.87–5.11)
RDW: 12.2 % (ref 11.5–15.5)
WBC: 7.7 10*3/uL (ref 4.0–10.5)

## 2023-11-14 LAB — CK: Total CK: 73 U/L (ref 7–177)

## 2023-11-14 LAB — TSH: TSH: 1.08 u[IU]/mL (ref 0.35–5.50)

## 2023-11-14 MED ORDER — CITALOPRAM HYDROBROMIDE 10 MG PO TABS: 10.0000 mg | ORAL_TABLET | Freq: Every day | ORAL | 3 refills | Status: DC

## 2023-11-14 MED ORDER — TRAZODONE HCL 50 MG PO TABS: 50.0000 mg | ORAL_TABLET | Freq: Every evening | ORAL | 3 refills | Status: DC | PRN

## 2023-11-14 NOTE — Patient Instructions (Addendum)
Please return in 6 months for hypertension follow up.   I will release your lab results to you on your MyChart account with further instructions. You may see the results before I do, but when I review them I will send you a message with my report or have my assistant call you if things need to be discussed. Please reply to my message with any questions. Thank you!   If you have any questions or concerns, please don't hesitate to send me a message via MyChart or call the office at [phone]. Thank you for visiting with [place] today! It's our pleasure caring for you.   VISIT SUMMARY:  During today's visit, we discussed your concerns about elevated blood pressure readings, a persistent scratchy sensation on your back, sleep disturbances, and changes in your skin. We also reviewed your history of abnormal mammograms and your current health maintenance routine.  YOUR PLAN:  -HYPERTENSION: Hypertension, or high blood pressure, means that the force of the blood against your artery walls is too high. We discussed the importance of accurate home monitoring and potential anxiety affecting your readings. Please obtain a home blood pressure cuff and monitor your blood pressure regularly. Continue with your current medication regimen, and we will reassess as needed. Today your reading was just above goal but not dangerously high: 138/88  -PERIMENOPAUSE: Perimenopause is the transition period before menopause when hormonal changes can cause symptoms like hot flashes, mood changes, and poor sleep. Continue with your lifestyle modifications such as exercise and healthy eating. Monitor your symptoms, and we can consider further interventions if they worsen.  -SLEEP DISTURBANCE: Sleep disturbances can affect your overall well-being. You are using trazodone as needed to help with sleep. We have refilled your prescription, and please be aware of the potential side effects.  -SKIN CHANGES OF AGING: Skin changes, such as  the development of lines and wrinkles, are a normal part of aging. We discussed potential aesthetic treatments if you wish to pursue them. Remember, these changes are not harmful.  -GENERAL HEALTH MAINTENANCE: You are up to date on your mammograms, with the recent follow-up showing no need for a biopsy. We will order blood work to ensure everything is in order. Continue with your regular mammogram screenings every six months.  INSTRUCTIONS:  Please follow up with your primary care provider as needed. Continue monitoring your blood pressure at home and keep track of any changes in your symptoms. Ensure you get your blood work done as ordered.
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
## 2023-11-14 NOTE — Progress Notes (Signed)
Labs reviewed.  The 10-year ASCVD risk score (Arnett DK, et al., 2019) is: 2.5%   Values used to calculate the score:     Age: 47 years     Sex: Female     Is Non-Hispanic African American: No     Diabetic: No     Tobacco smoker: Yes     Systolic Blood Pressure: 138 mmHg     Is BP treated: Yes     HDL Cholesterol: 49.5 mg/dL     Total Cholesterol: 131 mg/dL

## 2024-04-18 ENCOUNTER — Telehealth: Payer: Self-pay | Admitting: Family Medicine

## 2024-04-18 ENCOUNTER — Ambulatory Visit
Admission: RE | Admit: 2024-04-18 | Discharge: 2024-04-18 | Disposition: A | Discharge status: Home / Self Care | Source: Ambulatory Visit | Attending: Family Medicine | Admitting: Family Medicine

## 2024-04-18 ENCOUNTER — Ambulatory Visit: Admitting: Family Medicine

## 2024-04-18 ENCOUNTER — Encounter: Payer: Self-pay | Admitting: Family Medicine

## 2024-04-18 ENCOUNTER — Ambulatory Visit

## 2024-04-18 VITALS — BP 115/82 | HR 67 | Temp 97.0°F | Resp 18 | Ht 67.0 in | Wt 136.0 lb

## 2024-04-18 DIAGNOSIS — R002 Palpitations: Secondary | ICD-10-CM

## 2024-04-18 DIAGNOSIS — F419 Anxiety disorder, unspecified: Secondary | ICD-10-CM | POA: Diagnosis not present

## 2024-04-18 DIAGNOSIS — R0602 Shortness of breath: Secondary | ICD-10-CM

## 2024-04-18 DIAGNOSIS — R071 Chest pain on breathing: Secondary | ICD-10-CM

## 2024-04-18 DIAGNOSIS — D6852 Prothrombin gene mutation: Secondary | ICD-10-CM

## 2024-04-18 DIAGNOSIS — Z86718 Personal history of other venous thrombosis and embolism: Secondary | ICD-10-CM | POA: Diagnosis present

## 2024-04-18 LAB — COMPREHENSIVE METABOLIC PANEL WITH GFR
ALT: 14 U/L (ref 0–35)
AST: 20 U/L (ref 0–37)
Albumin: 4.7 g/dL (ref 3.5–5.2)
Alkaline Phosphatase: 46 U/L (ref 39–117)
BUN: 8 mg/dL (ref 6–23)
CO2: 31 meq/L (ref 19–32)
Calcium: 9.5 mg/dL (ref 8.4–10.5)
Chloride: 94 meq/L — ABNORMAL LOW (ref 96–112)
Creatinine, Ser: 0.83 mg/dL (ref 0.40–1.20)
GFR: 85.03 mL/min (ref >60.00)
Glucose, Bld: 96 mg/dL (ref 70–99)
Potassium: 3.6 meq/L (ref 3.5–5.1)
Sodium: 132 meq/L — ABNORMAL LOW (ref 135–145)
Total Bilirubin: 0.9 mg/dL (ref 0.2–1.2)
Total Protein: 7 g/dL (ref 6.0–8.3)

## 2024-04-18 LAB — CBC WITH DIFFERENTIAL/PLATELET
Basophils Absolute: 0 10*3/uL (ref 0.0–0.1)
Basophils Relative: 0.2 % (ref 0.0–3.0)
Eosinophils Absolute: 0 10*3/uL (ref 0.0–0.7)
Eosinophils Relative: 0.4 % (ref 0.0–5.0)
HCT: 43.5 % (ref 36.0–46.0)
Hemoglobin: 14.8 g/dL (ref 12.0–15.0)
Lymphocytes Relative: 11.5 % — ABNORMAL LOW (ref 12.0–46.0)
Lymphs Abs: 1.4 10*3/uL (ref 0.7–4.0)
MCHC: 34 g/dL (ref 30.0–36.0)
MCV: 93.7 fl (ref 78.0–100.0)
Monocytes Absolute: 0.9 10*3/uL (ref 0.1–1.0)
Monocytes Relative: 7.4 % (ref 3.0–12.0)
Neutro Abs: 9.7 10*3/uL — ABNORMAL HIGH (ref 1.4–7.7)
Neutrophils Relative %: 80.5 % — ABNORMAL HIGH (ref 43.0–77.0)
Platelets: 226 10*3/uL (ref 150.0–400.0)
RBC: 4.64 Mil/uL (ref 3.87–5.11)
RDW: 12.2 % (ref 11.5–15.5)
WBC: 12 10*3/uL — ABNORMAL HIGH (ref 4.0–10.5)

## 2024-04-18 MED ORDER — BUSPIRONE HCL 5 MG PO TABS: 5.0000 mg | ORAL_TABLET | Freq: Two times a day (BID) | ORAL | 0 refills | Status: DC

## 2024-04-18 MED ORDER — IOHEXOL 350 MG/ML SOLN
75.0000 mL | Freq: Once | INTRAVENOUS | Status: AC | PRN
  Administered 2024-04-18: 75 mL via INTRAVENOUS

## 2024-04-18 MED ORDER — OMEPRAZOLE 40 MG PO CPDR: 40.0000 mg | DELAYED_RELEASE_CAPSULE | Freq: Two times a day (BID) | ORAL | 1 refills | Status: DC

## 2024-04-18 NOTE — Patient Instructions (Signed)
 Ct, event monitor Decrease caffeine Start omeprazole  twice daily Start buspar twice daily Sleep propped up some

## 2024-04-18 NOTE — Telephone Encounter (Signed)
 LVM to possibly triage due to listed symptoms.
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
## 2024-04-18 NOTE — Progress Notes (Unsigned)
 EP to read.

## 2024-04-19 ENCOUNTER — Ambulatory Visit: Payer: Self-pay | Admitting: Family Medicine

## 2024-04-19 NOTE — Progress Notes (Signed)
CT OK

## 2024-04-22 LAB — TSH: TSH: 1.06 u[IU]/mL (ref 0.35–5.50)

## 2024-04-22 NOTE — Progress Notes (Signed)
 See mychart note Dear Ms. Lauren Matthews, Your lab results show an elevated WBC that could be from infection (sore throat) and I will recheck it at your follow up visit. Other labs are normal.  I hope you are feeling better.  Sincerely, Dr. Jonelle Neri

## 2024-04-22 NOTE — Progress Notes (Signed)
 See mychart note Dear Ms. Charlesetta Connors, Your Chest Ct did not show any abnormalities.  Sincerely, Dr. Jonelle Neri

## 2024-04-23 ENCOUNTER — Telehealth: Payer: Self-pay

## 2024-04-23 NOTE — Telephone Encounter (Signed)
 Copied from CRM [id number]. Topic: Clinical - Medical Advice >> Apr 23, 2024 10:33 AM Adonis Hoot wrote: Reason for CRM: Patient is requesting a phone call for CMA to discuss test results. She said that she still do not feel well and consulted a family friend that is sin the medical field and they gave her some insight about some things.  Pt stated that she still does not fel well and would like to discus other options. She was wonder if the infection did  not settle in her chest from  the dental work.

## 2024-04-24 ENCOUNTER — Ambulatory Visit: Admitting: Family Medicine

## 2024-04-24 NOTE — Telephone Encounter (Signed)
 LVM informing pt to call back to schedule an OV w/ PCP.

## 2024-05-08 DIAGNOSIS — R002 Palpitations: Secondary | ICD-10-CM | POA: Diagnosis not present

## 2024-05-08 DIAGNOSIS — R0602 Shortness of breath: Secondary | ICD-10-CM | POA: Diagnosis not present

## 2024-05-08 NOTE — Progress Notes (Signed)
 See mychart note Dear Ms. Lauren Matthews, Your heart monitor results do not show any significant arrhythmias. Your results are reassuring.  Please follow up with me in the office if you are still having problems.  Sincerely, Dr. Jonelle Neri

## 2024-05-10 ENCOUNTER — Other Ambulatory Visit: Payer: Self-pay | Admitting: Family Medicine

## 2024-07-23 ENCOUNTER — Telehealth: Payer: Self-pay | Admitting: Internal Medicine

## 2024-07-23 MED ORDER — LINACLOTIDE 145 MCG PO CAPS: 145.0000 ug | ORAL_CAPSULE | Freq: Every day | ORAL | 6 refills | Status: DC

## 2024-07-23 NOTE — Telephone Encounter (Signed)
 Inbound call from pt requesting a refill on her Linzess . Please advise.

## 2024-07-23 NOTE — Telephone Encounter (Signed)
 Done

## 2024-07-25 NOTE — Telephone Encounter (Signed)
 Inbound call from patient stating she was told by pharmacy they can not release medication Linzess  to her due to insurance needing approval from provider.   Please advise  Thank you

## 2024-07-28 ENCOUNTER — Telehealth: Payer: Self-pay

## 2024-07-28 ENCOUNTER — Other Ambulatory Visit (HOSPITAL_COMMUNITY): Payer: Self-pay

## 2024-07-28 NOTE — Telephone Encounter (Signed)
 Pharmacy Patient Advocate Encounter   Received notification from Pt Calls Messages that prior authorization for Linzess  capsules is required/requested.   Insurance verification completed.   The patient is insured through [facility] .   Per test claim: PA required; PA submitted to above mentioned insurance via Latent Key/confirmation #/EOC [id number] Status is pending

## 2024-07-29 ENCOUNTER — Other Ambulatory Visit (HOSPITAL_COMMUNITY): Payer: Self-pay

## 2024-07-30 NOTE — Telephone Encounter (Signed)
 Pharmacy Patient Advocate Encounter  Received notification from [employer] that Prior Authorization for Linzess  capsules has been APPROVED from 07-28-2024 to 07-28-2025   PA #/Case ID/Reference #: [id number]

## 2024-09-08 ENCOUNTER — Telehealth: Payer: Self-pay

## 2024-09-08 NOTE — Telephone Encounter (Signed)
 Copied from CRM [id number]. Topic: Clinical - Medical Advice >> Sep 05, 2024  2:24 PM [place] J wrote: Reason for CRM: Patient saw that her potassium was low and wanted to know if Dr. Jodie wanted to follow up with her via appointment.  Message was sent to PCP to address

## 2024-09-22 ENCOUNTER — Ambulatory Visit: Admitting: Family

## 2024-09-22 ENCOUNTER — Encounter: Payer: Self-pay | Admitting: Family

## 2024-09-22 VITALS — BP 147/90 | HR 66 | Temp 98.1°F | Ht 67.0 in | Wt 134.1 lb

## 2024-09-22 DIAGNOSIS — I10 Essential (primary) hypertension: Secondary | ICD-10-CM | POA: Diagnosis not present

## 2024-09-22 DIAGNOSIS — R300 Dysuria: Secondary | ICD-10-CM

## 2024-09-22 LAB — POCT URINALYSIS DIPSTICK
Bilirubin, UA: NEGATIVE
Blood, UA: NEGATIVE
Glucose, UA: NEGATIVE
Ketones, UA: NEGATIVE
Leukocytes, UA: NEGATIVE
Nitrite, UA: POSITIVE — AB
Protein, UA: NEGATIVE
Spec Grav, UA: 1.02 (ref 1.010–1.025)
Urobilinogen, UA: 0.2 U/dL
pH, UA: 6 (ref 5.0–8.0)

## 2024-09-22 MED ORDER — SULFAMETHOXAZOLE-TRIMETHOPRIM 800-160 MG PO TABS
1.0000 | ORAL_TABLET | Freq: Two times a day (BID) | ORAL | 0 refills | Status: DC
Start: 2024-09-22 — End: 2024-10-21
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
## 2024-10-04 ENCOUNTER — Other Ambulatory Visit: Payer: Self-pay | Admitting: Family Medicine

## 2024-10-13 ENCOUNTER — Telehealth: Payer: Self-pay | Admitting: Internal Medicine

## 2024-10-13 NOTE — Telephone Encounter (Signed)
 Inbound call from patient stating that she is needing a sooner appointment then December the 22 nd due to her having bloating and excessive gas along with hemorrhoid pain. Patient is requesting a call back. Please advise.

## 2024-10-13 NOTE — Telephone Encounter (Signed)
 Attempted to reach patient to discuss earlier appointment.  Left VM to please return call.

## 2024-10-21 ENCOUNTER — Encounter: Payer: Self-pay | Admitting: Gastroenterology

## 2024-10-21 ENCOUNTER — Ambulatory Visit: Admitting: Gastroenterology

## 2024-10-21 VITALS — BP 116/70 | HR 70 | Ht 67.0 in | Wt 136.4 lb

## 2024-10-21 DIAGNOSIS — K602 Anal fissure, unspecified: Secondary | ICD-10-CM

## 2024-10-21 DIAGNOSIS — K649 Unspecified hemorrhoids: Secondary | ICD-10-CM

## 2024-10-21 DIAGNOSIS — K6289 Other specified diseases of anus and rectum: Secondary | ICD-10-CM | POA: Diagnosis not present

## 2024-10-21 DIAGNOSIS — K5900 Constipation, unspecified: Secondary | ICD-10-CM | POA: Diagnosis not present

## 2024-10-21 MED ORDER — AMBULATORY NON FORMULARY MEDICATION: 1 refills | Status: DC

## 2024-10-21 NOTE — Patient Instructions (Addendum)
 Anal fissure Use topical ointment as prescribed with gloved finger Will take 4-6 weeks to heal Keep stools soft Sitz baths may help  Constipation Samples linzess  290mcg po daily , take in capsule 30-45 mins before first meal of day with full glass of water  We have sent a prescription for Diltiazem gel 2% with Lidocaine  5% to [facility] to treat your anal fissure. You should apply a pea size amount to your rectum four (4) times daily for four (4) weeks  [facility] information is below: Address: [address], [place], [place] [address]  [phone]  *Please DO NOT go directly from our office to pick up this medication! Give the pharmacy 1 day to process the prescription as this is compounded and takes time to make.   _______________________________________________________  If your blood pressure at your visit was 140/90 or greater, please contact your primary care physician to follow up on this.  _______________________________________________________  If you are age 3 or older, your body mass index should be between 23-30. Your Body mass index is 21.36 kg/m. If this is out of the aforementioned range listed, please consider follow up with your Primary Care Provider.  If you are age 30 or younger, your body mass index should be between 19-25. Your Body mass index is 21.36 kg/m. If this is out of the aformentioned range listed, please consider follow up with your Primary Care Provider.   ________________________________________________________  The [facility] providers would like to encourage you to use MYCHART to communicate with providers for non-urgent requests or questions.  Due to long hold times on the telephone, sending your provider a message by [facility] may be a faster and more efficient way to get a response.  Please allow 48 business hours for a response.  Please remember that this is for non-urgent requests.   _______________________________________________________  [facility] is using a team-based approach to care.  Your team is made up of your doctor and two to three APPS. Our APPS (Nurse Practitioners and Physician Assistants) work with your physician to ensure care continuity for you. They are fully qualified to address your health concerns and develop a treatment plan. They communicate directly with your gastroenterologist to care for you. Seeing the Advanced Practice Practitioners on your physician's team can help you by facilitating care more promptly, often allowing for earlier appointments, access to diagnostic testing, procedures, and other specialty referrals.   Thank you for trusting me with your gastrointestinal care. Deanna May, FNP-C
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
## 2024-11-14 ENCOUNTER — Other Ambulatory Visit: Payer: Self-pay | Admitting: Family Medicine

## 2024-11-16 ENCOUNTER — Other Ambulatory Visit: Payer: Self-pay | Admitting: Family Medicine

## 2024-11-17 ENCOUNTER — Encounter: Admitting: Family Medicine

## 2024-11-20 ENCOUNTER — Ambulatory Visit (INDEPENDENT_AMBULATORY_CARE_PROVIDER_SITE_OTHER): Admitting: Family Medicine

## 2024-11-20 ENCOUNTER — Encounter: Payer: Self-pay | Admitting: Family Medicine

## 2024-11-20 VITALS — BP 106/78 | HR 58 | Temp 97.1°F | Ht 67.0 in | Wt 136.2 lb

## 2024-11-20 DIAGNOSIS — K581 Irritable bowel syndrome with constipation: Secondary | ICD-10-CM

## 2024-11-20 DIAGNOSIS — D6852 Prothrombin gene mutation: Secondary | ICD-10-CM | POA: Diagnosis not present

## 2024-11-20 DIAGNOSIS — F339 Major depressive disorder, recurrent, unspecified: Secondary | ICD-10-CM

## 2024-11-20 DIAGNOSIS — Z0000 Encounter for general adult medical examination without abnormal findings: Secondary | ICD-10-CM | POA: Diagnosis not present

## 2024-11-20 DIAGNOSIS — K635 Polyp of colon: Secondary | ICD-10-CM | POA: Diagnosis not present

## 2024-11-20 DIAGNOSIS — Z0001 Encounter for general adult medical examination with abnormal findings: Secondary | ICD-10-CM

## 2024-11-20 DIAGNOSIS — I10 Essential (primary) hypertension: Secondary | ICD-10-CM

## 2024-11-20 DIAGNOSIS — F5101 Primary insomnia: Secondary | ICD-10-CM | POA: Diagnosis not present

## 2024-11-20 LAB — COMPREHENSIVE METABOLIC PANEL WITH GFR
ALT: 11 U/L (ref 3–35)
AST: 18 U/L (ref 5–37)
Albumin: 4.5 g/dL (ref 3.5–5.2)
Alkaline Phosphatase: 41 U/L (ref 39–117)
BUN: 11 mg/dL (ref 6–23)
CO2: 26 meq/L (ref 19–32)
Calcium: 9.4 mg/dL (ref 8.4–10.5)
Chloride: 103 meq/L (ref 96–112)
Creatinine, Ser: 0.86 mg/dL (ref 0.40–1.20)
GFR: 81.15 mL/min (ref >60.00)
Glucose, Bld: 84 mg/dL (ref 70–99)
Potassium: 4 meq/L (ref 3.5–5.1)
Sodium: 138 meq/L (ref 135–145)
Total Bilirubin: 0.8 mg/dL (ref 0.2–1.2)
Total Protein: 6.6 g/dL (ref 6.0–8.3)

## 2024-11-20 LAB — CBC WITH DIFFERENTIAL/PLATELET
Basophils Absolute: 0.1 K/uL (ref 0.0–0.1)
Basophils Relative: 0.5 % (ref 0.0–3.0)
Eosinophils Absolute: 0.1 K/uL (ref 0.0–0.7)
Eosinophils Relative: 0.7 % (ref 0.0–5.0)
HCT: 43.1 % (ref 36.0–46.0)
Hemoglobin: 14.6 g/dL (ref 12.0–15.0)
Lymphocytes Relative: 11.5 % — ABNORMAL LOW (ref 12.0–46.0)
Lymphs Abs: 1.3 K/uL (ref 0.7–4.0)
MCHC: 33.9 g/dL (ref 30.0–36.0)
MCV: 97.4 fl (ref 78.0–100.0)
Monocytes Absolute: 0.8 K/uL (ref 0.1–1.0)
Monocytes Relative: 6.6 % (ref 3.0–12.0)
Neutro Abs: 9.3 K/uL — ABNORMAL HIGH (ref 1.4–7.7)
Neutrophils Relative %: 80.7 % — ABNORMAL HIGH (ref 43.0–77.0)
Platelets: 226 K/uL (ref 150.0–400.0)
RBC: 4.43 Mil/uL (ref 3.87–5.11)
RDW: 12.7 % (ref 11.5–15.5)
WBC: 11.5 K/uL — ABNORMAL HIGH (ref 4.0–10.5)

## 2024-11-20 LAB — LIPID PANEL
Cholesterol: 136 mg/dL (ref 28–200)
HDL: 55.6 mg/dL (ref >39.00)
LDL Cholesterol: 69 mg/dL (ref 10–99)
NonHDL: 80.16
Total CHOL/HDL Ratio: 2
Triglycerides: 57 mg/dL (ref 10.0–149.0)
VLDL: 11.4 mg/dL (ref 0.0–40.0)

## 2024-11-20 LAB — TSH: TSH: 1.26 u[IU]/mL (ref 0.35–5.50)
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
## 2024-11-20 NOTE — Patient Instructions (Signed)
 Please return in 12 months for your annual complete physical; please come fasting.   I will release your lab results to you on your MyChart account with further instructions. You may see the results before I do, but when I review them I will send you a message with my report or have my assistant call you if things need to be discussed. Please reply to my message with any questions. Thank you!   If you have any questions or concerns, please don't hesitate to send me a message via MyChart or call the office at [phone]. Thank you for visiting with us  today! It's our pleasure caring for you.    VISIT SUMMARY: Today, you had your annual physical exam where we discussed your general health and managed your ongoing medical issues. We reviewed your perimenopausal symptoms, hypertension, sleep disturbances, gastrointestinal issues, and a recent hand lesion. We also discussed your upcoming breast cancer screening and performed routine blood work.  YOUR PLAN: -PERIMENOPAUSAL SYMPTOMS: Perimenopause is the transition period before menopause when hormonal changes can cause symptoms like irregular menstrual cycles, mood changes, and sleep disturbances. We discussed the potential benefits of hormone therapy in the future, but for now, consider taking magnesium glycinate to help with sleep and DHEA as a hormone precursor supplement. We will monitor your symptoms and consider hormone therapy if they persist.  -ESSENTIAL HYPERTENSION: Hypertension is high blood pressure. Your blood pressure is well-controlled with your current medication, so no changes are needed at this time.  -PRIMARY INSOMNIA: Primary insomnia is difficulty falling or staying asleep. This may be related to your perimenopausal symptoms. We discussed non-drug methods to improve your sleep, such as taking magnesium glycinate and using brain calming music.  -IRRITABLE BOWEL SYNDROME WITH CONSTIPATION: Irritable bowel syndrome (IBS) is a chronic  condition affecting the digestive system, causing symptoms like constipation and discomfort. You are currently managing this with Linzess . We discussed the potential benefits of BPC 157 peptide for gut health and the use of stool softeners as needed. You are also encouraged to make dietary changes, including eating more greens.  -HAND LESION: You have developed a painful, red bump on your hand, possibly related to a cyst. We will monitor this lesion and consider further treatment if it does not improve.  INSTRUCTIONS: Please follow up with your gastroenterologist as planned. Your annual mammogram is scheduled for February. Continue with your current medications and consider the suggested supplements and dietary changes. If your symptoms persist or worsen, please schedule a follow-up appointment.                      Contains text generated by [employer]bridge.                                 Contains text generated by [employer]bridge.

## 2024-11-21 ENCOUNTER — Encounter: Payer: Self-pay | Admitting: Family Medicine

## 2024-11-24 ENCOUNTER — Ambulatory Visit: Admitting: Gastroenterology

## 2024-11-25 NOTE — Telephone Encounter (Signed)
 Please review and advise. Tks

## 2024-11-28 ENCOUNTER — Other Ambulatory Visit: Payer: Self-pay

## 2024-11-28 ENCOUNTER — Ambulatory Visit: Payer: Self-pay | Admitting: Family Medicine

## 2024-11-28 DIAGNOSIS — D72829 Elevated white blood cell count, unspecified: Secondary | ICD-10-CM

## 2024-11-28 NOTE — Progress Notes (Signed)
 Please call patient:labs show elevated WBC as she is aware with a high percentage of neutrophils. I recommend referral to hematology to look into causes for this; I can't link it to her bowel/hemorrhoid problems necessarily. Other lab results look fine.

## 2024-12-09 ENCOUNTER — Other Ambulatory Visit: Payer: Self-pay | Admitting: Family Medicine

## 2024-12-09 NOTE — Telephone Encounter (Signed)
 11/20/2024 LOV  11/17/2024 fill date  60/5 refills

## 2024-12-15 ENCOUNTER — Emergency Department (HOSPITAL_BASED_OUTPATIENT_CLINIC_OR_DEPARTMENT_OTHER): Payer: Self-pay | Admitting: Radiology

## 2024-12-15 ENCOUNTER — Emergency Department (HOSPITAL_BASED_OUTPATIENT_CLINIC_OR_DEPARTMENT_OTHER)
Admission: EM | Admit: 2024-12-15 | Discharge: 2024-12-15 | Disposition: A | Discharge status: Home / Self Care | Payer: Self-pay

## 2024-12-15 ENCOUNTER — Encounter (HOSPITAL_BASED_OUTPATIENT_CLINIC_OR_DEPARTMENT_OTHER): Payer: Self-pay | Admitting: Emergency Medicine

## 2024-12-15 ENCOUNTER — Ambulatory Visit: Payer: Self-pay

## 2024-12-15 ENCOUNTER — Other Ambulatory Visit: Payer: Self-pay

## 2024-12-15 DIAGNOSIS — R202 Paresthesia of skin: Secondary | ICD-10-CM | POA: Diagnosis present

## 2024-12-15 DIAGNOSIS — R002 Palpitations: Secondary | ICD-10-CM

## 2024-12-15 DIAGNOSIS — N644 Mastodynia: Secondary | ICD-10-CM | POA: Insufficient documentation

## 2024-12-15 LAB — CBC
HCT: 42.9 % (ref 36.0–46.0)
Hemoglobin: 15.7 g/dL — ABNORMAL HIGH (ref 12.0–15.0)
MCH: 33.8 pg (ref 26.0–34.0)
MCHC: 36.6 g/dL — ABNORMAL HIGH (ref 30.0–36.0)
MCV: 92.3 fL (ref 80.0–100.0)
Platelets: 211 K/uL (ref 150–400)
RBC: 4.65 MIL/uL (ref 3.87–5.11)
RDW: 11.6 % (ref 11.5–15.5)
WBC: 12.8 K/uL — ABNORMAL HIGH (ref 4.0–10.5)
nRBC: 0 % (ref 0.0–0.2)

## 2024-12-15 LAB — BASIC METABOLIC PANEL WITH GFR
Anion gap: 14 (ref 5–15)
BUN: 7 mg/dL (ref 6–20)
CO2: 25 mmol/L (ref 22–32)
Calcium: 10 mg/dL (ref 8.9–10.3)
Chloride: 92 mmol/L — ABNORMAL LOW (ref 98–111)
Creatinine, Ser: 0.86 mg/dL (ref 0.44–1.00)
GFR, Estimated: >60 mL/min
Glucose, Bld: 114 mg/dL — ABNORMAL HIGH (ref 70–99)
Potassium: 3.5 mmol/L (ref 3.5–5.1)
Sodium: 131 mmol/L — ABNORMAL LOW (ref 135–145)

## 2024-12-15 LAB — HEPATIC FUNCTION PANEL
ALT: 13 U/L (ref 0–44)
AST: 22 U/L (ref 15–41)
Albumin: 4.8 g/dL (ref 3.5–5.0)
Alkaline Phosphatase: 56 U/L (ref 38–126)
Bilirubin, Direct: 0.3 mg/dL — ABNORMAL HIGH (ref 0.0–0.2)
Indirect Bilirubin: 0.3 mg/dL (ref 0.3–0.9)
Total Bilirubin: 0.5 mg/dL (ref 0.0–1.2)
Total Protein: 7.1 g/dL (ref 6.5–8.1)

## 2024-12-15 LAB — TROPONIN T, HIGH SENSITIVITY: Troponin T High Sensitivity: <15 ng/L (ref 0–19)

## 2024-12-15 MED ORDER — HYDROXYZINE HCL 25 MG PO TABS: 25.0000 mg | ORAL_TABLET | Freq: Two times a day (BID) | ORAL | 0 refills | Status: DC | PRN

## 2024-12-15 MED ORDER — HYDROXYZINE HCL 25 MG PO TABS
25.0000 mg | ORAL_TABLET | Freq: Once | ORAL | Status: AC
  Administered 2024-12-15: 25 mg via ORAL
  Filled 2024-12-15: qty 1

## 2024-12-15 MED ORDER — LACTATED RINGERS IV BOLUS
1000.0000 mL | Freq: Once | INTRAVENOUS | Status: AC
  Administered 2024-12-15: 1000 mL via INTRAVENOUS

## 2024-12-15 NOTE — Telephone Encounter (Signed)
 Please review advise about medication - hydrOXYzine  HCL- 25 mg Oral 2 times daily PRN

## 2024-12-15 NOTE — Discharge Instructions (Addendum)
 As we discussed, your evaluation in the ED does not identify the cause of your recurrent symptoms. In regard to breast tenderness, a pregnancy test was not done and was something you were comfortable following up on when seen by your OB/GYN.   Take hydroxyzine  as recommended - up to twice a day as needed. Return to the ED any any time you have symptoms that escalate or become concerning. Otherwise, plan for outpatient follow up for further investigation as was discussed.

## 2024-12-15 NOTE — Telephone Encounter (Signed)
 FYI Only or Action Required?: FYI only for provider: ED advised.  Patient was last seen in primary care on 11/20/2024 by Jodie Lavern CROME, MD.  Called Nurse Triage reporting Hypertension.  Symptoms began several months ago.  Interventions attempted: Rest, hydration, or home remedies.  Symptoms are: gradually worsening.  Triage Disposition: Go to ED Now (Notify PCP)  Patient/caregiver understands and will follow disposition?: Yes      Copied from CRM [id number]. Topic: Clinical - Red Word Triage >> Dec 15, 2024 11:25 AM Deleta RAMAN wrote: Red Word that prompted transfer to Nurse Triage: patient does not feel well blood pressure 148/89. Hard for her to focus don't feel like normal self Reason for Disposition  [1] Numbness (i.e., loss of sensation) of the face, arm / hand, or leg / foot on one side of the body AND [2] sudden onset AND [3] brief (now gone)  Answer Assessment - Initial Assessment Questions This RN recommended pt be examined in hospital asap. Pt verbalized understanding, will head to ER via loved one. Advised call 911 if any new or worsening symptoms.    Pt reporting ongoing symptoms for maybe 6 months, worsening  Symptoms: Don't feel like myself Higher WBC count Fatigue Dizziness Intermittent numbness to both arms equally Upper back pain Difficulty concentrating/focusing Pinky finger on left hand went numb last week Don't feel like I'm in my body Frustration with her symptoms  Denies: Current weakness or numbness to one side Change to awareness/consciousness SOB Chest pain  Protocols used: Neurologic Deficit-A-AH

## 2024-12-15 NOTE — ED Triage Notes (Signed)
 Pt endorses concern for htn and BUE numbness today. States I just dont feel good. Speech clear, aox4.

## 2024-12-15 NOTE — Telephone Encounter (Signed)
 FYI

## 2024-12-15 NOTE — ED Provider Notes (Signed)
" °  Physical Exam  BP (!) 168/94 (BP Location: Right Arm)   Pulse 73   Temp 98.5 F (36.9 C) (Oral)   Resp 18   Wt 61.2 kg   LMP 12/15/2024   SpO2 97%   BMI 21.14 kg/m   Physical Exam  Procedures  Procedures  ED Course / MDM    Medical Decision Making Amount and/or Complexity of Data Reviewed Labs: ordered. Radiology: ordered.  Risk Prescription drug management.   Bad since May, intermittent Today, felt overwhelmed, dissociated from her body with tingling into hands.  Labs pending Hydroxyzine  and fluids - rechecked.  2:45 - introduction to the patient and her parents. She reports feeling better after receiving hydroxyzine . VSS, BP improved.   Reviewed her symptoms and she confirms sudden onset, episodic feelings of tenseness, suddenly overwhelmed, heart racing, today with tingling into her hands. Sometimes she can actively calm symptoms and be more comfortable, sometimes they persist. She also reports significant breast soreness bilaterally. No nipple discharge or bleeding. She reports bilateral tubal ligation remotely, currently menstruating. She did not provide a specimen for pregnancy but she reports this is not a concern.   Discussed follow up with OB/GYN to discuss hormonal fluctuations as the cause of symptoms. Discussed anxiety as a possible diagnosis requiring PCP follow up. Will provide hydroxyzine  #6 for prn relief. Discussed return precautions.        Odell Balls, PA-C 12/15/24 1509  "

## 2024-12-15 NOTE — ED Provider Notes (Signed)
 " [place] EMERGENCY DEPARTMENT AT [facility] Provider Note   CSN: [id number] Arrival date & time: 12/15/24  1220     Patient presents with: Hypertension   Lauren Matthews is a 49 y.o. female.    Hypertension   Patient is a 49 year old female present emergency room today with complaints of feeling unwell.  Seems that she has had some episodes of feeling unwell since May she had some dyspnea and was worked up outpatient had thyroid levels checked, CT PE study which was negative and was referred to hematology oncology.  She has not seen them yet.  She denies any chest pain or dyspnea difficulty breathing currently.  She states that she was at work and felt overwhelmed and had some upper extremity numbness/tingling.  She feels somewhat better already.  No nausea or vomiting or abdominal pain.     Prior to Admission medications  Medication Sig Start Date End Date Taking? Authorizing Provider  AMBULATORY NON FORMULARY MEDICATION Medication Name: Diltiazem 2% + Lidaocaine 5% Apply a pea sized amount internally four times daily. (With gloved finger) 3 times daily for 6 weeks 10/21/24   May, Deanna J, NP  busPIRone  (BUSPAR ) 5 MG tablet TAKE 1 TABLET BY MOUTH TWICE A DAY 12/09/24   Jodie Lavern CROME, MD  hydrocortisone  2.5 % cream APPLY SPARINGLY TO AFFECTED AREA 2 TO 4 TIMES A DAY    [provider]  linaclotide  (LINZESS ) 145 MCG CAPS capsule Take 1 capsule (145 mcg total) by mouth daily before breakfast. 07/23/24   Dorsey, Ying C, MD  losartan -hydrochlorothiazide  (HYZAAR) 100-25 MG tablet TAKE 1 TABLET BY MOUTH EVERY DAY 10/06/24   Jodie Lavern CROME, MD  spironolactone (ALDACTONE) 50 MG tablet 1 tablet at bedtime Orally Once a day; Duration: 30 days 08/29/24   [provider]  traZODone  (DESYREL ) 50 MG tablet TAKE 1 TABLET BY MOUTH AT BEDTIME AS NEEDED FOR SLEEP. 11/17/24   Jodie Lavern CROME, MD  triamcinolone cream (KENALOG) 0.1 %  02/12/24   [provider]     Allergies: Ace inhibitors    Review of Systems  Updated Vital Signs BP (!) 168/94 (BP Location: Right Arm)   Pulse 73   Temp 98.5 F (36.9 C) (Oral)   Resp 18   Wt 61.2 kg   LMP 12/15/2024   SpO2 97%   BMI 21.14 kg/m   Physical Exam Vitals and nursing note reviewed.  Constitutional:      General: She is not in acute distress. HENT:     Head: Normocephalic and atraumatic.     Nose: Nose normal.     Mouth/Throat:     Mouth: Mucous membranes are dry.  Eyes:     General: No scleral icterus. Cardiovascular:     Rate and Rhythm: Normal rate and regular rhythm.     Pulses: Normal pulses.     Heart sounds: Normal heart sounds.  Pulmonary:     Effort: Pulmonary effort is normal. No respiratory distress.     Breath sounds: No wheezing.  Abdominal:     Palpations: Abdomen is soft.     Tenderness: There is no abdominal tenderness. There is no guarding or rebound.  Musculoskeletal:     Cervical back: Normal range of motion.     Right lower leg: No edema.     Left lower leg: No edema.  Skin:    General: Skin is warm and dry.     Capillary Refill: Capillary refill takes less than 2  seconds.  Neurological:     Mental Status: She is alert. Mental status is at baseline.  Psychiatric:        Mood and Affect: Mood normal.        Behavior: Behavior normal.     (all labs ordered are listed, but only abnormal results are displayed) Labs Reviewed  BASIC METABOLIC PANEL WITH GFR - Abnormal; Notable for the following components:      Result Value   Sodium 131 (*)    Chloride 92 (*)    Glucose, Bld 114 (*)    All other components within normal limits  CBC - Abnormal; Notable for the following components:   WBC 12.8 (*)    Hemoglobin 15.7 (*)    MCHC 36.6 (*)    All other components within normal limits  PREGNANCY, URINE  HEPATIC FUNCTION PANEL  TROPONIN T, HIGH SENSITIVITY    EKG: None  Radiology: No results found.   Procedures   Medications Ordered in the ED   hydrOXYzine  (ATARAX ) tablet 25 mg (25 mg Oral Given 12/15/24 1312)  lactated ringers  bolus 1,000 mL (1,000 mLs Intravenous New Bag/Given 12/15/24 1314)                                    Medical Decision Making Amount and/or Complexity of Data Reviewed Labs: ordered. Radiology: ordered.  Risk Prescription drug management.   Patient is a 49 year old female present emergency room today with complaints of feeling unwell.  Seems that she has had some episodes of feeling unwell since May she had some dyspnea and was worked up outpatient had thyroid levels checked, CT PE study which was negative and was referred to hematology oncology.  She has not seen them yet.  She denies any chest pain or dyspnea difficulty breathing currently.  She states that she was at work and felt overwhelmed and had some upper extremity numbness/tingling.  She feels somewhat better already.  No nausea or vomiting or abdominal pain.  Will obtain labs, chest x-ray, EKG.  Overall I feel reassured by her normal neuroexam and well-appearing presentation however will obtain labs provide some lactated Ringer 's and hydroxyzine  which I offered to her after shared decision making over sedation she is comfortable with treating with these medications and comfortable with plan to follow-up outpatient if workup returns reassuring.     1:24 PM Care of @PATIENTNAME @ transferred to PA SU and Dr. MAYBELL at the end of my shift as the patient will require reassessment once labs/imaging have resulted. Patient presentation, ED course, and plan of care discussed with review of all pertinent labs and imaging. Please see his/her note for further details regarding further ED course and disposition. Plan at time of handoff is re-evaluation. This may be altered or completely changed at the discretion of the oncoming team pending results of further workup.   Final diagnoses:  None    ED Discharge Orders     None          Neldon Hamp RAMAN, [place] 12/15/24 1324  "

## 2024-12-17 ENCOUNTER — Other Ambulatory Visit: Payer: Self-pay | Admitting: Family Medicine

## 2024-12-17 NOTE — Telephone Encounter (Signed)
 Sche on 12/25/2024/aw

## 2024-12-17 NOTE — Telephone Encounter (Signed)
 FYI-patient is asking for additional pills until her appointment on Dec 25, 2024.

## 2024-12-17 NOTE — Telephone Encounter (Signed)
 Copied from CRM [id number]. Topic: Clinical - Medication Refill >> Dec 17, 2024  4:14 PM Kevelyn M wrote: Medication: hydrOXYzine  (ATARAX ) 25 MG tablet, patient is asking for additional pills until her appointment on Dec 25, 2024.  Has the patient contacted their pharmacy? No (Agent: If no, request that the patient contact the pharmacy for the refill. If patient does not wish to contact the pharmacy document the reason why and proceed with request.) (Agent: If yes, when and what did the pharmacy advise?)  This is the patient's preferred pharmacy:  [id number] [address], [place] - [address] [place] [address] Phone: [phone] Fax: [phone]  Is this the correct pharmacy for this prescription? Yes If no, delete pharmacy and type the correct one.   Has the prescription been filled recently? Yes  Is the patient out of the medication? Yes  Has the patient been seen for an appointment in the last year OR does the patient have an upcoming appointment? Yes  Can we respond through MyChart? Yes  Agent: Please be advised that Rx refills may take up to 3 business days. We ask that you follow-up with your pharmacy.

## 2024-12-18 ENCOUNTER — Telehealth: Payer: Self-pay | Admitting: Family Medicine

## 2024-12-18 MED ORDER — HYDROXYZINE PAMOATE 25 MG PO CAPS: 25.0000 mg | ORAL_CAPSULE | Freq: Two times a day (BID) | ORAL | 0 refills | Status: DC | PRN

## 2024-12-18 NOTE — Telephone Encounter (Signed)
 Patient scheduled and is requesting TOC from Dr. Jodie to Dr. Wendolyn. Please advise.

## 2024-12-25 ENCOUNTER — Ambulatory Visit: Admitting: Family Medicine

## 2024-12-25 ENCOUNTER — Encounter: Payer: Self-pay | Admitting: Family Medicine

## 2024-12-25 VITALS — BP 120/82 | HR 70 | Temp 97.7°F | Ht 67.0 in | Wt 137.2 lb

## 2024-12-25 DIAGNOSIS — R0609 Other forms of dyspnea: Secondary | ICD-10-CM | POA: Diagnosis not present

## 2024-12-25 DIAGNOSIS — I10 Essential (primary) hypertension: Secondary | ICD-10-CM

## 2024-12-25 DIAGNOSIS — Z8249 Family history of ischemic heart disease and other diseases of the circulatory system: Secondary | ICD-10-CM | POA: Diagnosis not present

## 2024-12-25 DIAGNOSIS — Z86718 Personal history of other venous thrombosis and embolism: Secondary | ICD-10-CM | POA: Diagnosis not present

## 2024-12-25 DIAGNOSIS — F410 Panic disorder [episodic paroxysmal anxiety] without agoraphobia: Secondary | ICD-10-CM

## 2024-12-25 DIAGNOSIS — F419 Anxiety disorder, unspecified: Secondary | ICD-10-CM

## 2024-12-25 NOTE — Progress Notes (Signed)
 "  Subjective  CC:  Chief Complaint  Patient presents with   Hospitalization Follow-up    12/15/2024 (2 hours) [facility] Emergency Department at [facility]  palpitations    HPI: Lauren Matthews is a 49 y.o. female who presents to the office today to address the problems listed above in the chief complaint. Discussed the use of AI scribe software for clinical note transcription with the patient, who gave verbal consent to proceed.  History of Present Illness Lauren Matthews is a 49 year old female who presents with stress and anxiety-related symptoms. I reviewed recent ER visit and summary, lab results. 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 Low back pain, tired, can't work out due to low stamina. Has had eval for palpitations in past but never an ischemia work up. She has strong fam hx of cardiac disease. She reports she used to work out all the time but aobut 3 months ago, noted she couldn't exercise without feeling fatigued or sob. No anginal sxs per se. She is worried that something is wrong with her and no one is detecting it. She is very frustrated and worries.  No edema. No sob at rest.   Musculoskeletal pain - Sharp pains in the back, a  new symptom not previously experienced  Fatigue and exercise intolerance - Lack of energy and inability to exercise as previously - Has not attended gym since September - Feels physically winded and fatigued, which is unusual for her  Menstrual irregularities and perimenopausal symptoms - Periods remain regular but occasionally experiences an extra period Clinical Research Associate and other providers have suggested possible perimenopause - Questions whether perimenopause may be affecting ability to cope with stress  Cardiac concerns - Concerned about family history of heart problems, as father and his siblings have had heart issues   Assessment  1. Anxiety   2. Panic attack   3. Essential hypertension   4. Family history of premature CAD   5. History of DVT (deep vein thrombosis)   6. DOE (dyspnea on exertion)      Plan  Assessment and Plan Assessment & Plan Generalized anxiety disorder with panic attacks Exacerbated by job stress and life circumstances. Symptoms include panic attacks, difficulty coping, and physical symptoms such as chest pain and palpitations. Hydroxyzine  is used as needed for acute anxiety episodes. Previous use of Celexa  was discontinued last year. Concerns about physical symptoms potentially related to anxiety were discussed, emphasizing the need to address both physical and mental health aspects. Discussed the role of neurotransmitter imbalance and the potential benefits of medications like Buspar  for daily anxiety symptoms. - Continue hydroxyzine  as needed for acute anxiety episodes. - Will consider Buspar  for daily anxiety symptoms. - pt declines restarting celexa  at this time as she didn't think it ever helped her. Not wanting to try  different medication at this time - Referred to a cardiologist for cardiac evaluation due to exertional dyspnea and chest pain and strong Fh of heart disease. Feel like talking to the specialist will be beneficial.  - would need to  consider PE given h/o DVT in past. No leg swelling, sxs of DVT now, hypoxia, tachycardia or EKG changes so defer eval now.  - Encouraged exploration of behavioral strategies such as yoga, deep breathing exercises, and self-help techniques. - Discussed potential for counseling if financially feasible.  Perimenopausal state Contributing to mood changes and stress. Hormonal fluctuations may affect coping mechanisms and exacerbate anxiety symptoms. Discussed the natural progression of perimenopause and its impact on mood and stress levels. - Continue to monitor symptoms and consider hormonal therapy if symptoms become unmanageable.  Low intermittent sharp back pain Possibly exacerbated by stress and anxiety. No specific physical cause identified. Discussed the potential role of anxiety in physical symptoms and the importance of addressing both physical and mental health aspects. - Continue to monitor symptoms and consider further evaluation if pain persists or worsens.  Fatigue and exertional dyspnea, cardiac evaluation planned Fatigue and exertional dyspnea potentially related to anxiety and stress. Cardiac evaluation planned to rule out underlying cardiac issues. Discussed the importance of understanding the context of symptoms to guide further evaluation. - Referred to a cardiologist for cardiac evaluation.   I personally spent a total of 52 minutes in the care of the patient today including preparing to see the patient, getting/reviewing separately obtained history, performing a medically appropriate exam/evaluation, counseling and educating, placing orders, documenting clinical information in the EHR, independently interpreting results, and communicating results.  Follow up: as needed No orders of the defined types were placed in this encounter.  No orders of the defined types were placed in this encounter.    I reviewed the patients updated PMH, FH, and SocHx.  Patient Active Problem List    Diagnosis Date Noted   Major depression, recurrent, chronic 05/14/2020    Priority: High   Family history of premature CAD 05/14/2020    Priority: High   Essential hypertension     Priority: High   ADD (attention deficit disorder) 03/11/2013    Priority: High   Serrated polyp of colon 05/25/2022    Priority: Medium    Irritable bowel syndrome 05/14/2020    Priority: Medium    Primary insomnia 05/14/2020    Priority: Medium    History of DVT (deep vein thrombosis)     Priority: Medium    Heterozygous for prothrombin II gene mutation 03/11/2013    Priority: Medium    Migraine headache 03/11/2013    Priority: Medium    Cough due to ACE inhibitor 12/24/2020    Priority: Low   Fibrocystic breast changes, left 09/21/2020    Priority: Low   Active Medications[1] Allergies: Patient is allergic to ace inhibitors. Family History: Patient family history includes ADD / ADHD in her mother; Bleeding Disorder in her father and sister; Breast cancer in her paternal grandmother; Colon polyps in her maternal uncle; Deep vein thrombosis in her father and sister; Depression in her mother; Diabetes in her paternal grandfather and paternal grandmother; Factor V Leiden deficiency in her father; Hearing loss in her father; Heart disease in her paternal aunt, paternal grandfather, and paternal uncle; Heart failure in her maternal grandfather; Hypertension in her maternal grandmother and mother; Obesity in her sister; Other in her sister; Prostate cancer in her paternal uncle; Protein S deficiency in her father  and sister; Pulmonary embolism in her father; Skin cancer in her father; Varicose Veins in her sister. Social History:  Patient  reports that she quit smoking about 6 years ago. Her smoking use included cigarettes. She started smoking about 26 years ago. She has a 2.4 pack-year smoking history. She has never used smokeless tobacco. She reports current alcohol use. She reports that she does not use  drugs.  Review of Systems: Constitutional: Negative for fever malaise or anorexia Cardiovascular: negative for chest pain Respiratory: negative for SOB or persistent cough Gastrointestinal: negative for abdominal pain  Objective  Vitals: BP 120/82   Pulse 70   Temp 97.7 F (36.5 C)   Ht 5' 7 (1.702 m)   Wt 137 lb 3.2 oz (62.2 kg)   LMP 12/15/2024   SpO2 99%   BMI 21.49 kg/m  General: no acute distress , A&Ox3, thin Psych: frustrated, fair insight HEENT: PEERL, conjunctiva normal, neck is supple  No visits with results within 1 Day(s) from this visit.  Latest known visit with results is:  Admission on 12/15/2024, Discharged on 12/15/2024  Component Date Value Ref Range Status   Sodium 12/15/2024 131 (L)  135 - 145 mmol/L Final   Potassium 12/15/2024 3.5  3.5 - 5.1 mmol/L Final   Chloride 12/15/2024 92 (L)  98 - 111 mmol/L Final   CO2 12/15/2024 25  22 - 32 mmol/L Final   Glucose, Bld 12/15/2024 114 (H)  70 - 99 mg/dL Final   BUN 98/87/7973 7  6 - 20 mg/dL Final   Creatinine, Ser 12/15/2024 0.86  0.44 - 1.00 mg/dL Final   Calcium 98/87/7973 10.0  8.9 - 10.3 mg/dL Final   GFR, Estimated 12/15/2024 >60  >60 mL/min Final   Anion gap 12/15/2024 14  5 - 15 Final   WBC 12/15/2024 12.8 (H)  4.0 - 10.5 K/uL Final   RBC 12/15/2024 4.65  3.87 - 5.11 MIL/uL Final   Hemoglobin 12/15/2024 15.7 (H)  12.0 - 15.0 g/dL Final   HCT 98/87/7973 42.9  36.0 - 46.0 % Final   MCV 12/15/2024 92.3  80.0 - 100.0 fL Final   MCH 12/15/2024 33.8  26.0 - 34.0 pg Final   MCHC 12/15/2024 36.6 (H)  30.0 - 36.0 g/dL Final   RDW 98/87/7973 11.6  11.5 - 15.5 % Final   Platelets 12/15/2024 211  150 - 400 K/uL Final   nRBC 12/15/2024 0.0  0.0 - 0.2 % Final   Troponin T High Sensitivity 12/15/2024 <15  0 - 19 ng/L Final   Total Protein 12/15/2024 7.1  6.5 - 8.1 g/dL Final   Albumin 98/87/7973 4.8  3.5 - 5.0 g/dL Final   AST 98/87/7973 22  15 - 41 U/L Final   ALT 12/15/2024 13  0 - 44 U/L Final   Alkaline  Phosphatase 12/15/2024 56  38 - 126 U/L Final   Total Bilirubin 12/15/2024 0.5  0.0 - 1.2 mg/dL Final   Bilirubin, Direct 12/15/2024 0.3 (H)  0.0 - 0.2 mg/dL Final   Indirect Bilirubin 12/15/2024 0.3  0.3 - 0.9 mg/dL Final    Commons side effects, risks, benefits, and alternatives for medications and treatment plan prescribed today were discussed, and the patient expressed understanding of the given instructions. Patient is instructed to call or message via MyChart if he/she has any questions or concerns regarding our treatment plan. No barriers to understanding were identified. We discussed Red Flag symptoms and signs in detail. Patient expressed understanding regarding what to  do in case of urgent or emergency type symptoms.  Medication list was reconciled, printed and provided to the patient in AVS. Patient instructions and summary information was reviewed with the patient as documented in the AVS. This note was prepared with assistance of Dragon voice recognition software. Occasional wrong-word or sound-a-like substitutions may have occurred due to the inherent limitations of voice recognition software    [1]  Current Meds  Medication Sig   AMBULATORY NON FORMULARY MEDICATION Medication Name: Diltiazem 2% + Lidaocaine 5% Apply a pea sized amount internally four times daily. (With gloved finger) 3 times daily for 6 weeks   busPIRone  (BUSPAR ) 5 MG tablet TAKE 1 TABLET BY MOUTH TWICE A DAY   hydrocortisone  2.5 % cream APPLY SPARINGLY TO AFFECTED AREA 2 TO 4 TIMES A DAY   hydrOXYzine  (VISTARIL ) 25 MG capsule Take 1 capsule (25 mg total) by mouth 2 (two) times daily as needed for anxiety.   linaclotide  (LINZESS ) 145 MCG CAPS capsule Take 1 capsule (145 mcg total) by mouth daily before breakfast.   losartan -hydrochlorothiazide  (HYZAAR) 100-25 MG tablet TAKE 1 TABLET BY MOUTH EVERY DAY   spironolactone (ALDACTONE) 50 MG tablet 1 tablet at bedtime Orally Once a day; Duration: 30 days   traZODone   (DESYREL ) 50 MG tablet TAKE 1 TABLET BY MOUTH AT BEDTIME AS NEEDED FOR SLEEP.   triamcinolone cream (KENALOG) 0.1 %    "

## 2024-12-26 ENCOUNTER — Ambulatory Visit: Admitting: Gastroenterology
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
## 2024-12-31 ENCOUNTER — Encounter: Admitting: Family Medicine

## 2025-01-01 ENCOUNTER — Encounter: Payer: Self-pay | Admitting: Student

## 2025-01-01 ENCOUNTER — Ambulatory Visit: Attending: Student | Admitting: Student

## 2025-01-01 VITALS — BP 118/70 | HR 78 | Ht 66.0 in | Wt 136.1 lb

## 2025-01-01 DIAGNOSIS — I10 Essential (primary) hypertension: Secondary | ICD-10-CM

## 2025-01-01 DIAGNOSIS — Z87898 Personal history of other specified conditions: Secondary | ICD-10-CM | POA: Diagnosis not present

## 2025-01-01 DIAGNOSIS — R0609 Other forms of dyspnea: Secondary | ICD-10-CM

## 2025-01-01 DIAGNOSIS — R072 Precordial pain: Secondary | ICD-10-CM

## 2025-01-01 DIAGNOSIS — R6889 Other general symptoms and signs: Secondary | ICD-10-CM | POA: Diagnosis not present

## 2025-01-01 MED ORDER — METOPROLOL TARTRATE 100 MG PO TABS: 100.0000 mg | ORAL_TABLET | Freq: Once | ORAL | 0 refills | Status: AC

## 2025-01-01 NOTE — Patient Instructions (Signed)
 Medication Instructions:  Your physician recommends that you continue on your current medications as directed. Please refer to the Current Medication list given to you today.  *If you need a refill on your cardiac medications before your next appointment, please call your pharmacy*  Lab Work: None ordered  If you have labs (blood work) drawn today and your tests are completely normal, you will receive your results only by: MyChart Message (if you have MyChart) OR A paper copy in the mail If you have any lab test that is abnormal or we need to change your treatment, we will call you to review the results.  Testing/Procedures: Your physician has requested that you have an echocardiogram. Echocardiography is a painless test that uses sound waves to create images of your heart. It provides your doctor with information about the size and shape of your heart and how well your hearts chambers and valves are working. This procedure takes approximately one hour. There are no restrictions for this procedure. Please do NOT wear cologne, perfume, aftershave, or lotions (deodorant is allowed). Please arrive 15 minutes prior to your appointment time.  Please note: We ask at that you not bring children with you during ultrasound (echo/ vascular) testing. Due to room size and safety concerns, children are not allowed in the ultrasound rooms during exams. Our front office staff cannot provide observation of children in our lobby area while testing is being conducted. An adult accompanying a patient to their appointment will only be allowed in the ultrasound room at the discretion of the ultrasound technician under special circumstances. We apologize for any inconvenience.   Your physician has requested that you have cardiac CT. Cardiac computed tomography (CT) is a painless test that uses an x-ray machine to take clear, detailed pictures of your heart. For further information please visit [email or web address]  Please follow instruction sheet BELOW:    Your cardiac CT  has been scheduled at:    [facility] [address]  [place], [place] [address]  01/14/25 ARRIVE AT 11:00   If scheduled at the [facility] at [facility], please enter the parking lot using the [address] entrance and use the FREE valet service at the patient drop-off area. Enter the building and check-in with registration on the main floor.   Please follow these instructions carefully (unless otherwise directed):  An IV will be required for this test and Nitroglycerin will be given.   On the Night Before the Test: Be sure to Drink plenty of water. Do not consume any caffeinated/decaffeinated beverages or chocolate 12 hours prior to your test. Do not take any antihistamines 12 hours prior to your test.    On the Day of the Test: Drink plenty of water until 1 hour prior to the test. Do not eat any food 1 hour prior to test. HOLD LOSARTAN -HYDROCHLOROTHIAZIDE  & SPIRONOLACTONE  THE MORNING OF  Take metoprolol  (Lopressor ) 100 MG two hours prior to test. THIS HAS BEEN SENT TO THE PHARMACY  F[facility]AL[facility]- please wear underwire-free bra if available, avoid dresses & tight clothing        After the Test: Drink plenty of water. After receiving IV contrast, you may experience a mild flushed feeling. This is normal. On occasion, you may experience a mild rash up to 24 hours after the test. This is not dangerous. If this occurs, you can take Benadryl  25 mg, Zyrtec, Claritin, or Allegra and increase your fluid intake. (Patients taking Tikosyn should  avoid Benadryl , and may take Zyrtec, Claritin, or Allegra) If you experience trouble breathing, this can be serious. If it is severe call 911 IMMEDIATELY. If it is mild, please call our office.  We will call to schedule your test 2-4 weeks out understanding that some insurance companies will need an authorization prior to the service being  performed.   For more information and frequently asked questions, please visit our website : [email or web address]  For non-scheduling related questions, please contact the cardiac imaging nurse navigator should you have any questions/concerns: Cardiac Imaging Nurse Navigators Direct Office Dial: [phone]   For scheduling needs, including cancellations and rescheduling, please call Brittany, [phone].  For billing questions, please call [phone].    Follow-Up: At [facility], you and your health needs are our priority.  As part of our continuing mission to provide you with exceptional heart care, our providers are all part of one team.  This team includes your primary Cardiologist (physician) and Advanced Practice Providers or APPs (Physician Assistants and Nurse Practitioners) who all work together to provide you with the care you need, when you need it.  Your next appointment:   Depending upon results.  Provider:   Callie Goodrich, PA-C          We recommend signing up for the patient portal called MyChart.  Sign up information is provided on this After Visit Summary.  MyChart is used to connect with patients for Virtual Visits (Telemedicine).  Patients are able to view lab/test results, encounter notes, upcoming appointments, etc.  Non-urgent messages can be sent to your provider as well.   To learn more about what you can do with MyChart, go to [email or web address].   Other Instructions

## 2025-01-04 ENCOUNTER — Telehealth: Payer: Self-pay

## 2025-01-04 NOTE — Telephone Encounter (Signed)
 Unable to VM phone disconnected before message could be left. Will try again in an hr.

## 2025-01-04 NOTE — Telephone Encounter (Signed)
 Spoke directly with patient advising of new appointments date and time, she was agreeable and verbalized understanding.

## 2025-01-04 NOTE — Telephone Encounter (Signed)
 LVM re: moved appt to 10:30 am on 01/06/25 due to weather

## 2025-01-05 ENCOUNTER — Inpatient Hospital Stay

## 2025-01-05 ENCOUNTER — Inpatient Hospital Stay: Admitting: Oncology

## 2025-01-06 ENCOUNTER — Other Ambulatory Visit: Payer: Self-pay | Admitting: Oncology

## 2025-01-06 ENCOUNTER — Encounter: Admitting: Family Medicine

## 2025-01-06 DIAGNOSIS — D72829 Elevated white blood cell count, unspecified: Secondary | ICD-10-CM

## 2025-01-09 ENCOUNTER — Ambulatory Visit: Admitting: Gastroenterology

## 2025-01-09 ENCOUNTER — Inpatient Hospital Stay

## 2025-01-09 ENCOUNTER — Other Ambulatory Visit: Payer: Self-pay | Admitting: Family Medicine

## 2025-01-09 DIAGNOSIS — D72829 Elevated white blood cell count, unspecified: Secondary | ICD-10-CM

## 2025-01-09 LAB — CMP (CANCER CENTER ONLY)
ALT: 14 U/L (ref 0–44)
AST: 20 U/L (ref 15–41)
Albumin: 4.7 g/dL (ref 3.5–5.0)
Alkaline Phosphatase: 54 U/L (ref 38–126)
Anion gap: 10 (ref 5–15)
BUN: 8 mg/dL (ref 6–20)
CO2: 25 mmol/L (ref 22–32)
Calcium: 9.6 mg/dL (ref 8.9–10.3)
Chloride: 101 mmol/L (ref 98–111)
Creatinine: 0.94 mg/dL (ref 0.44–1.00)
GFR, Estimated: >60 mL/min
Glucose, Bld: 107 mg/dL — ABNORMAL HIGH (ref 70–99)
Potassium: 4.2 mmol/L (ref 3.5–5.1)
Sodium: 137 mmol/L (ref 135–145)
Total Bilirubin: 0.7 mg/dL (ref 0.0–1.2)
Total Protein: 6.9 g/dL (ref 6.5–8.1)

## 2025-01-09 LAB — CBC WITH DIFFERENTIAL (CANCER CENTER ONLY)
Abs Immature Granulocytes: 0.05 10*3/uL (ref 0.00–0.07)
Basophils Absolute: 0.1 10*3/uL (ref 0.0–0.1)
Basophils Relative: 1 %
Eosinophils Absolute: 0.1 10*3/uL (ref 0.0–0.5)
Eosinophils Relative: 1 %
HCT: 42 % (ref 36.0–46.0)
Hemoglobin: 15.1 g/dL — ABNORMAL HIGH (ref 12.0–15.0)
Immature Granulocytes: 1 %
Lymphocytes Relative: 15 %
Lymphs Abs: 1.4 10*3/uL (ref 0.7–4.0)
MCH: 33.6 pg (ref 26.0–34.0)
MCHC: 36 g/dL (ref 30.0–36.0)
MCV: 93.3 fL (ref 80.0–100.0)
Monocytes Absolute: 0.6 10*3/uL (ref 0.1–1.0)
Monocytes Relative: 6 %
Neutro Abs: 7.5 10*3/uL (ref 1.7–7.7)
Neutrophils Relative %: 76 %
Platelet Count: 216 10*3/uL (ref 150–400)
RBC: 4.5 MIL/uL (ref 3.87–5.11)
RDW: 11.6 % (ref 11.5–15.5)
WBC Count: 9.7 10*3/uL (ref 4.0–10.5)
nRBC: 0 % (ref 0.0–0.2)

## 2025-01-09 LAB — SEDIMENTATION RATE: Sed Rate: 3 mm/h (ref 0–22)

## 2025-01-09 LAB — C-REACTIVE PROTEIN: CRP: <0.5 mg/dL

## 2025-01-09 LAB — TSH: TSH: 1.51 u[IU]/mL (ref 0.350–4.500)

## 2025-01-09 LAB — LACTATE DEHYDROGENASE: LDH: 154 U/L (ref 105–235)

## 2025-01-10 ENCOUNTER — Inpatient Hospital Stay: Admitting: Oncology

## 2025-01-14 ENCOUNTER — Ambulatory Visit (HOSPITAL_COMMUNITY)

## 2025-01-22 ENCOUNTER — Ambulatory Visit (HOSPITAL_BASED_OUTPATIENT_CLINIC_OR_DEPARTMENT_OTHER)
